# Patient Record
Sex: Female | Born: 1970
Health system: Southern US, Community
[De-identification: ages and names within clinical notes are randomized; demographics above are authoritative.]

## PROBLEM LIST (undated history)

## (undated) DIAGNOSIS — M778 Other enthesopathies, not elsewhere classified: Secondary | ICD-10-CM

## (undated) DIAGNOSIS — I10 Essential (primary) hypertension: Secondary | ICD-10-CM

## (undated) DIAGNOSIS — M5430 Sciatica, unspecified side: Secondary | ICD-10-CM

## (undated) HISTORY — PX: NO PAST SURGERIES: SHX2092

## (undated) HISTORY — DX: Other enthesopathies, not elsewhere classified: M77.8

---

## 1998-08-01 HISTORY — PX: TUBAL LIGATION: SHX77

## 2002-01-02 ENCOUNTER — Other Ambulatory Visit: Admission: RE | Admit: 2002-01-02 | Discharge: 2002-01-02 | Payer: Self-pay | Admitting: *Deleted

## 2002-12-09 ENCOUNTER — Other Ambulatory Visit: Admission: RE | Admit: 2002-12-09 | Discharge: 2002-12-09 | Payer: Self-pay | Admitting: *Deleted

## 2003-09-24 ENCOUNTER — Inpatient Hospital Stay (HOSPITAL_COMMUNITY): Admission: AD | Admit: 2003-09-24 | Discharge: 2003-10-03 | Payer: Self-pay | Admitting: *Deleted

## 2003-10-17 ENCOUNTER — Encounter (INDEPENDENT_AMBULATORY_CARE_PROVIDER_SITE_OTHER): Payer: Self-pay | Admitting: Specialist

## 2003-10-17 ENCOUNTER — Inpatient Hospital Stay (HOSPITAL_COMMUNITY): Admission: RE | Admit: 2003-10-17 | Discharge: 2003-10-20 | Payer: Self-pay | Admitting: *Deleted

## 2003-11-26 ENCOUNTER — Other Ambulatory Visit: Admission: RE | Admit: 2003-11-26 | Discharge: 2003-11-26 | Payer: Self-pay | Admitting: *Deleted

## 2006-01-26 IMAGING — US US FETAL BPP W/O NONSTRESS
1 series · 18 of 25 positions shown · non-contrast
Comparison: none

CLINICAL DATA: Oligohydramnios.  Previous history of fetal demise.

[Series 1: us fetal bpp w/o nonstress · non-contrast · 18 of 25 slices shown]
[im 1/25]
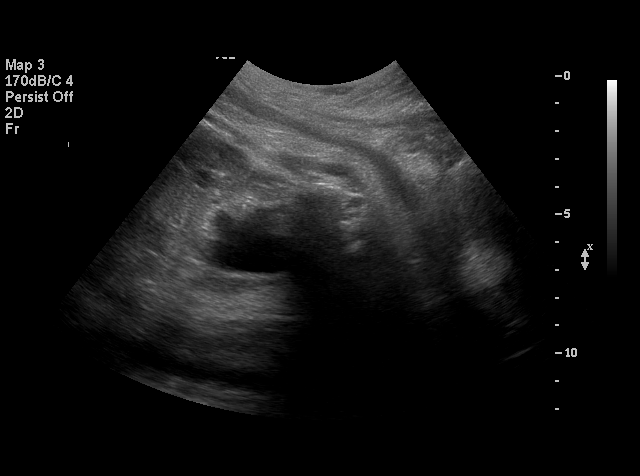
[im 3/25]
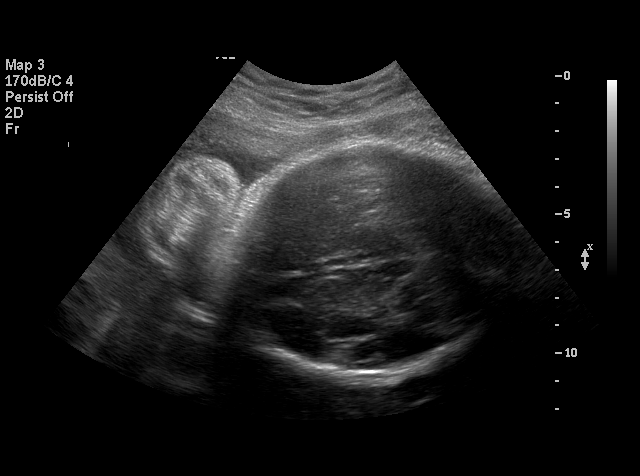
[im 4/25]
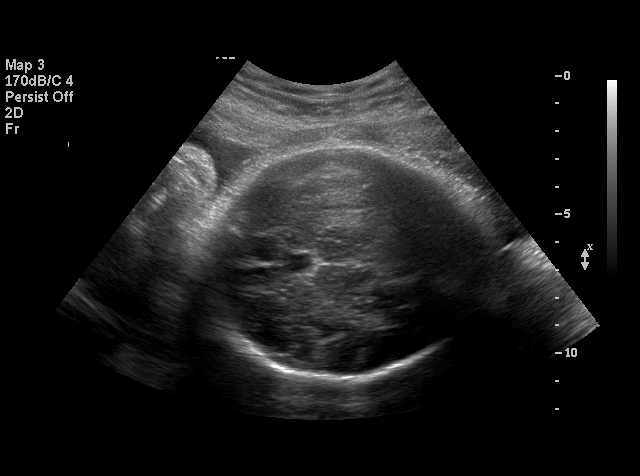
[im 5/25]
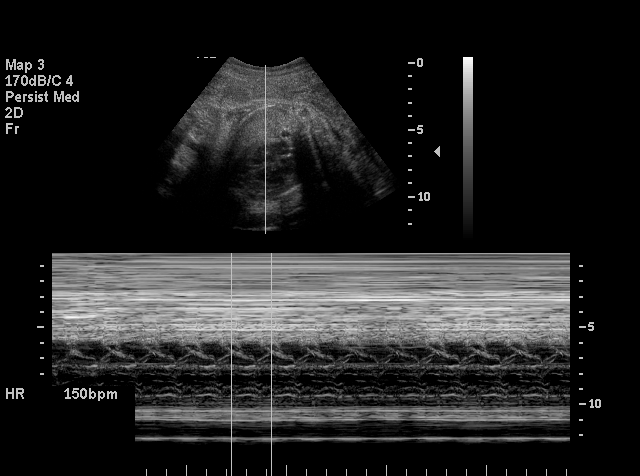
[im 7/25]
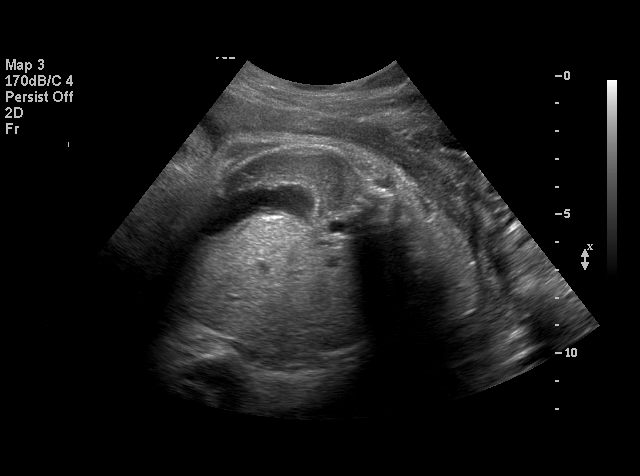
[im 8/25]
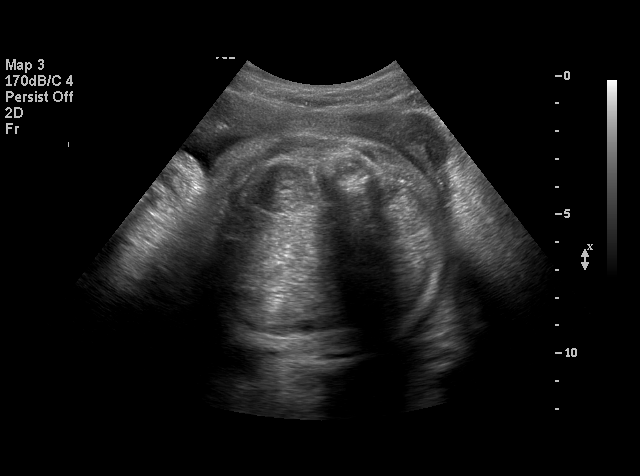
[im 10/25]
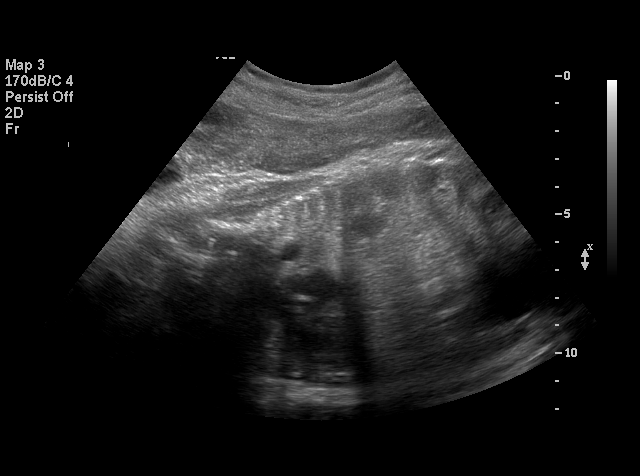
[im 11/25]
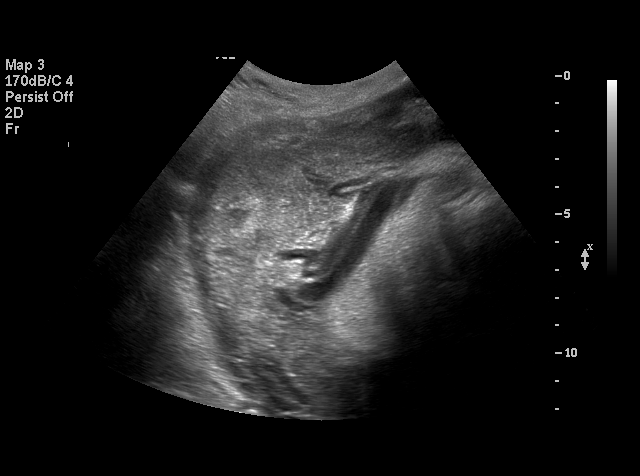
[im 12/25]
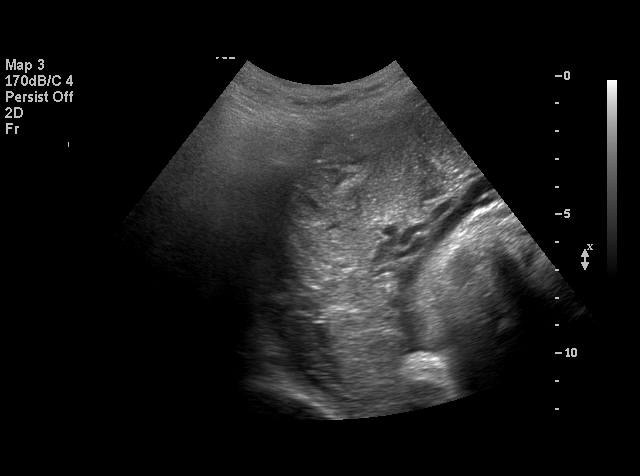
[im 14/25]
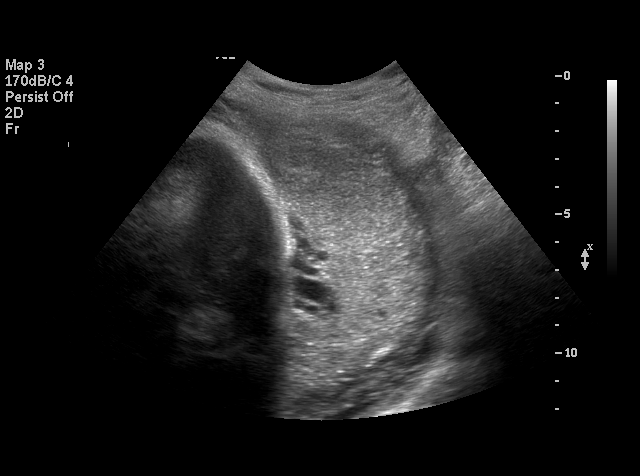
[im 15/25]
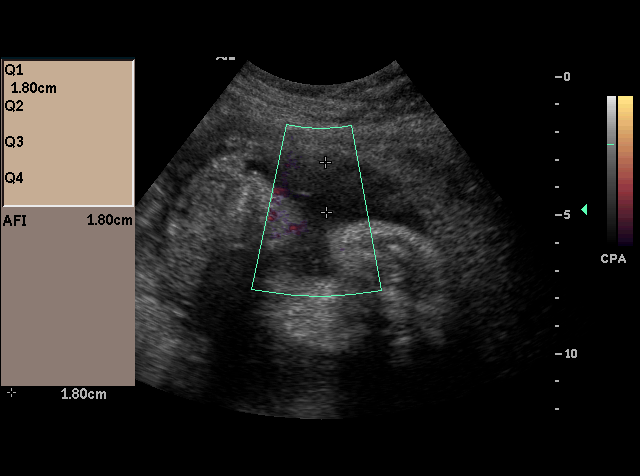
[im 16/25]
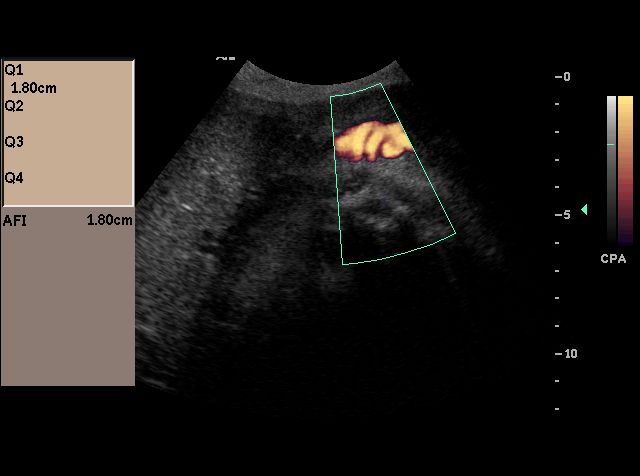
[im 18/25]
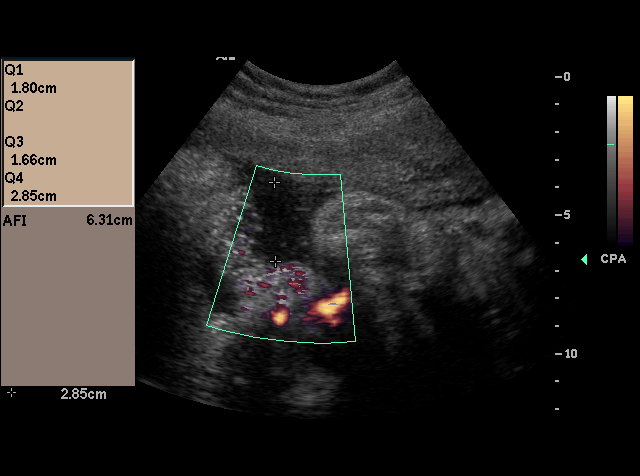
[im 19/25]
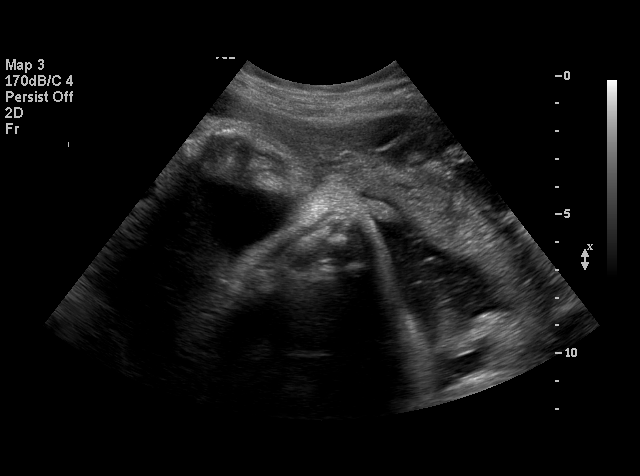
[im 21/25]
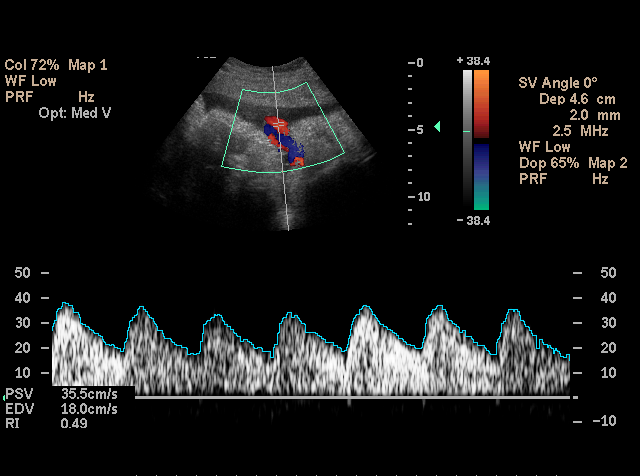
[im 22/25]
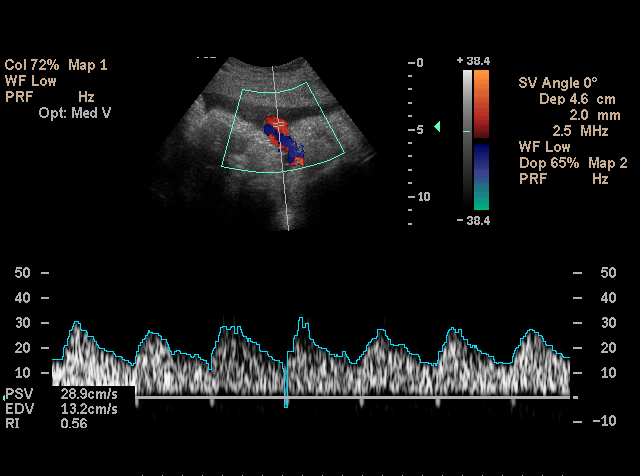
[im 23/25]
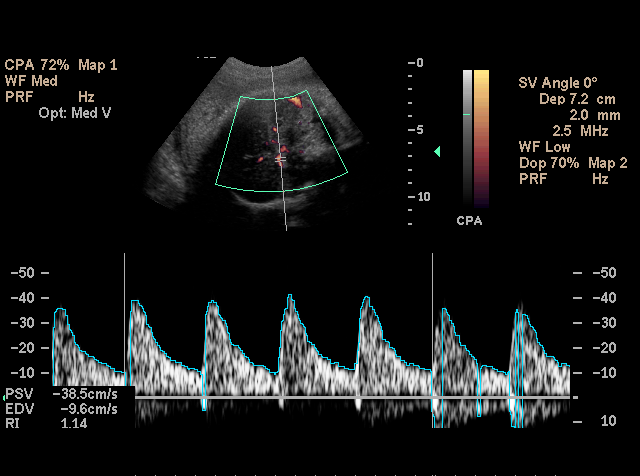
[im 25/25]
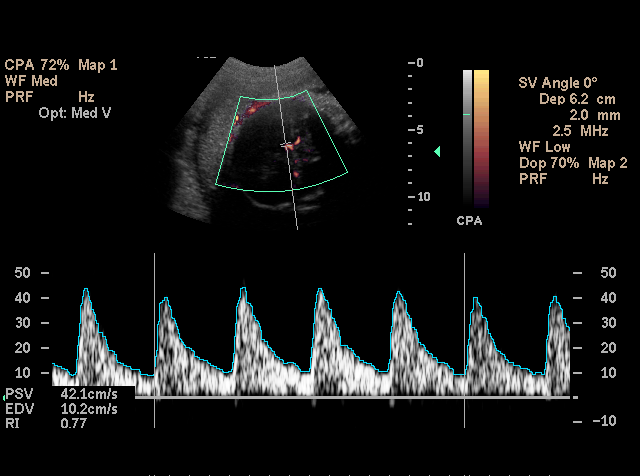

[18 of 25 positions shown; findings below may reference images not displayed]

BIOPHYSICAL PROFILE

Number of Fetuses: 1
Heart rate: 150 BPM
Movement: Yes
Breathing: No
Presentation: Breech
Placental Location:  Posterior
Grade: I 
Previa:  No
Amniotic Fluid (Subjective): Decreased
Amniotic Fluid (Objective): 6.3 cm AFI (6th-56th %ile = 8.3 to 24.5 cm for 33 weeks)

Fetal measurements and complete anatomic evaluation were not requested.  The following fetal anatomy was visualized on this exam:  lateral ventricles, thalamus, stomach, kidneys, bladder, and diaphragm.

BPP SCORING
Movements: 2               Time:  30 min.
Breathing: 0
Tone: 2
Amniotic Fluid: 2
Total Score: 6

MATERNAL FINDINGS:
Cervix:   Not evaluated.
IMPRESSION: Single living intrauterine fetus in breech presentation.  Amniotic fluid volume is subjectively decreased, with AFI of 6.3 cm.  Biophysical profile score is [DATE].
DOPPLER ULTRASOUND OF FETUS

Umbilical Artery S/D Ratio:  2.0 (NL< 3.70)

Middle Cerebral Artery PI: 1.69  (NL> 1.40)

IMPRESSION 
Both umbilical artery and fetal middle cerebral artery Doppler measurements are within normal range.

## 2006-01-27 IMAGING — US US OB COMP +14 WK
1 series · 18 of 26 positions shown · non-contrast
Comparison: none

CLINICAL DATA: 32-year-old.  G2 P2 with LMP 02/08/03.  Oligohydramnios.  Preterm labor.

[Series 1: us ob comp +14 wk · 18 of 26 slices shown]
[im 1/26]
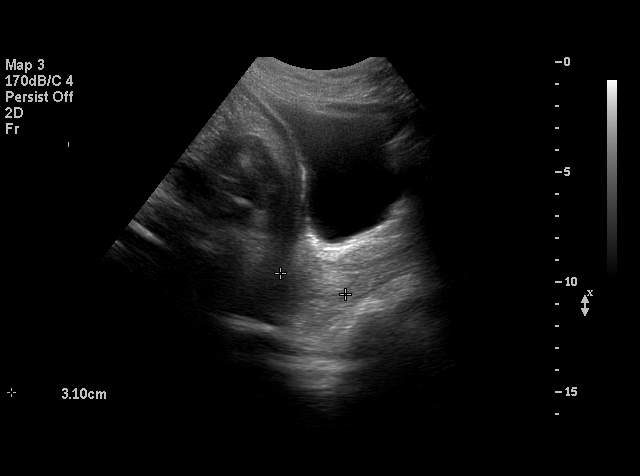
[im 3/26]
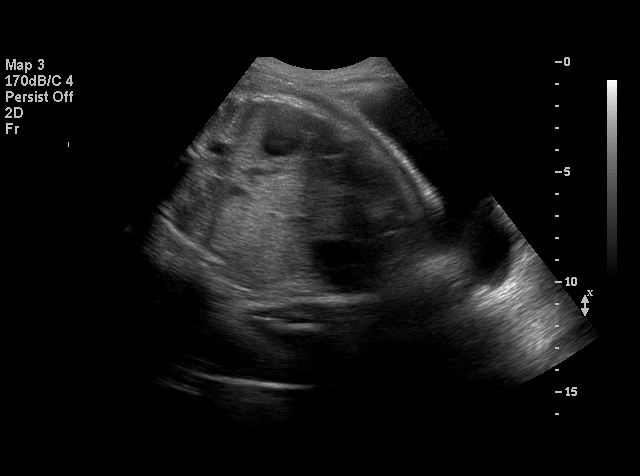
[im 4/26]
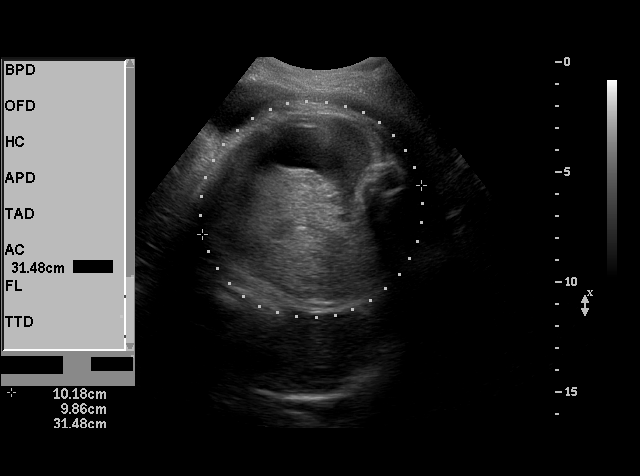
[im 6/26]
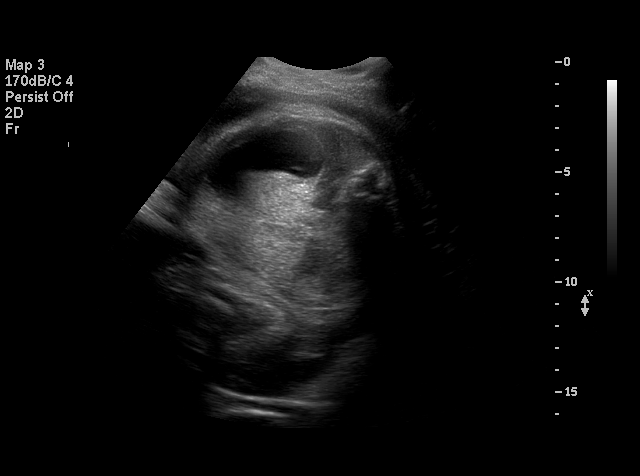
[im 7/26]
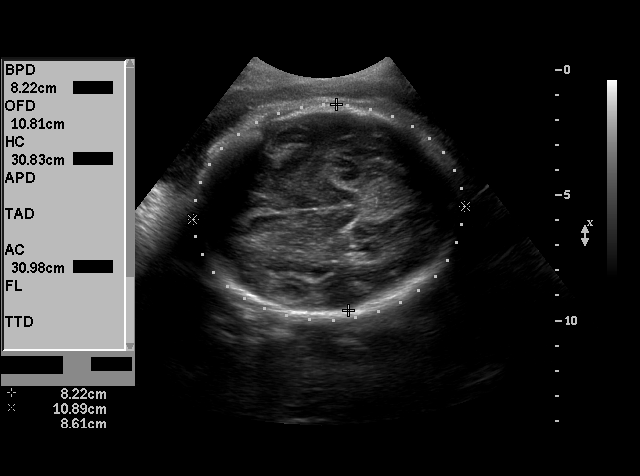
[im 8/26]
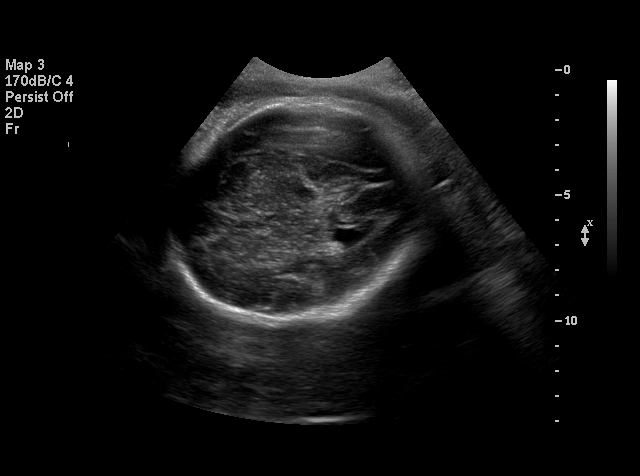
[im 10/26]
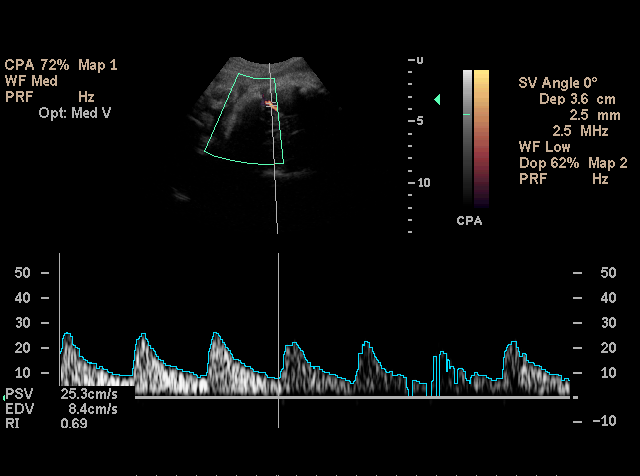
[im 11/26]
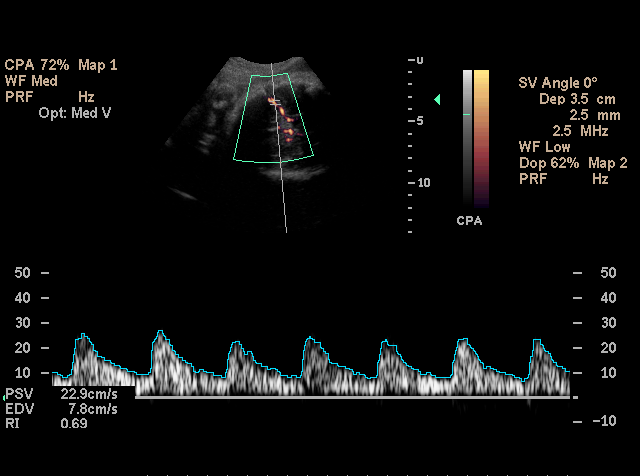
[im 13/26]
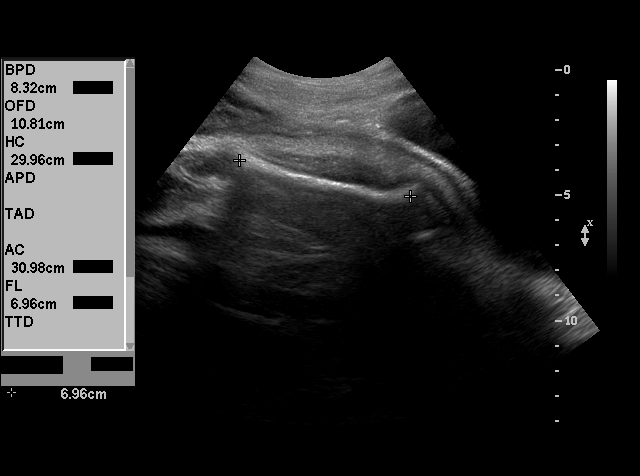
[im 14/26]
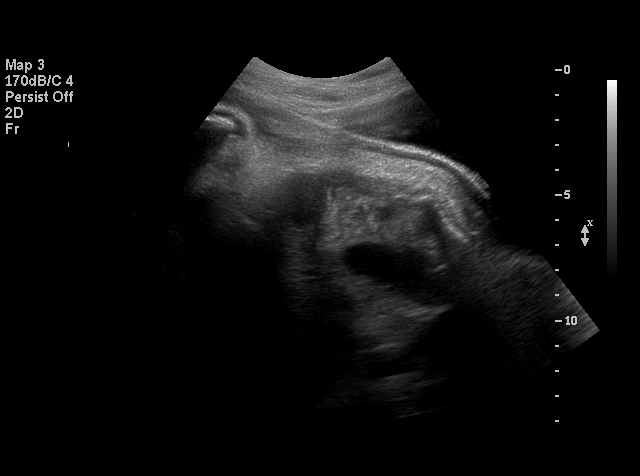
[im 16/26]
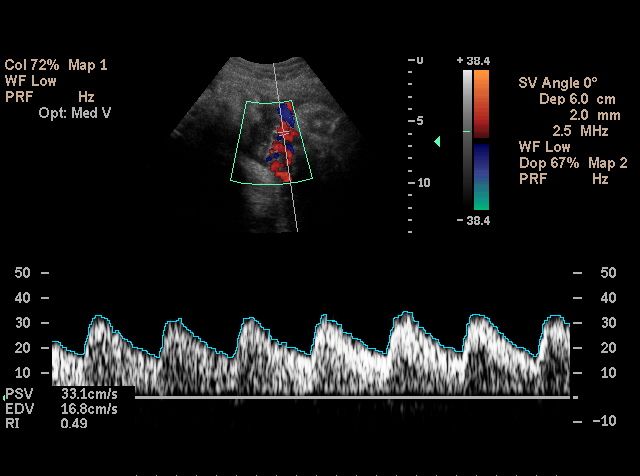
[im 17/26]
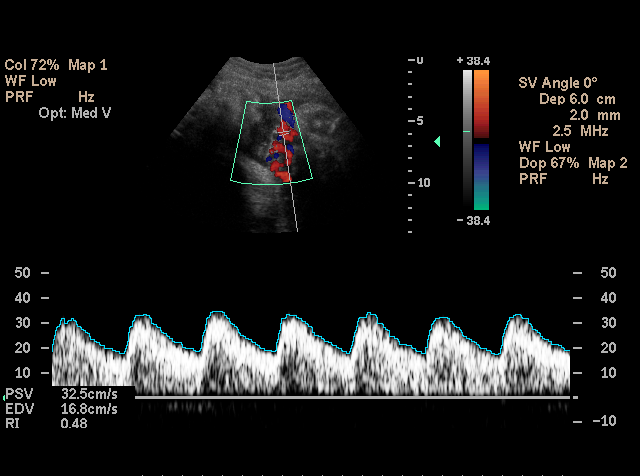
[im 19/26]
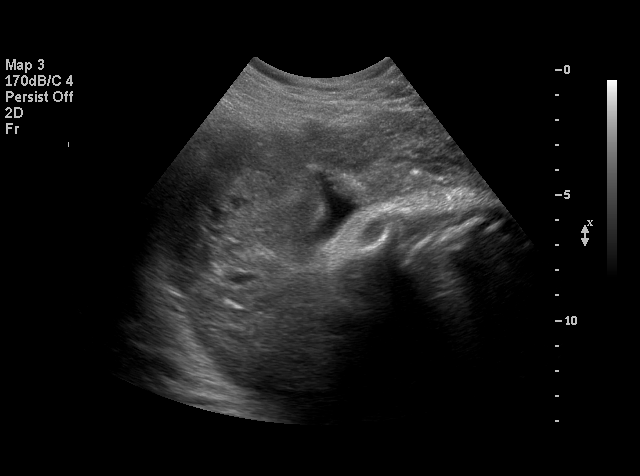
[im 20/26]
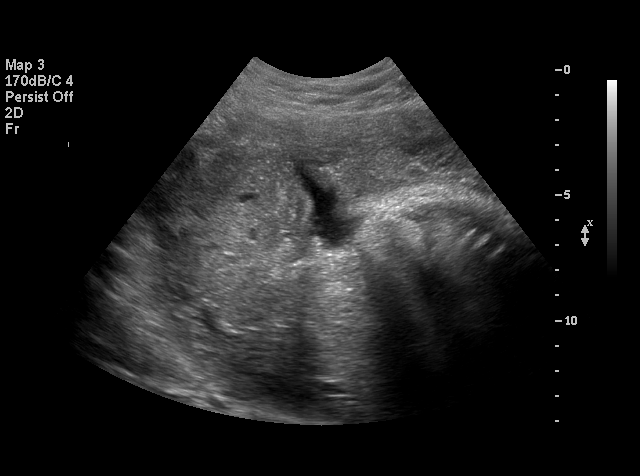
[im 21/26]
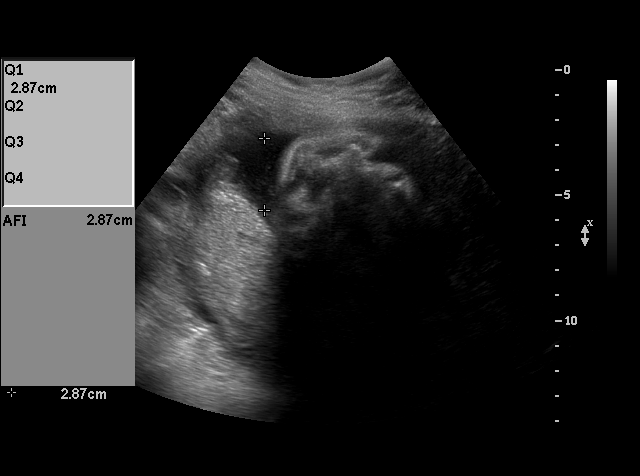
[im 23/26]
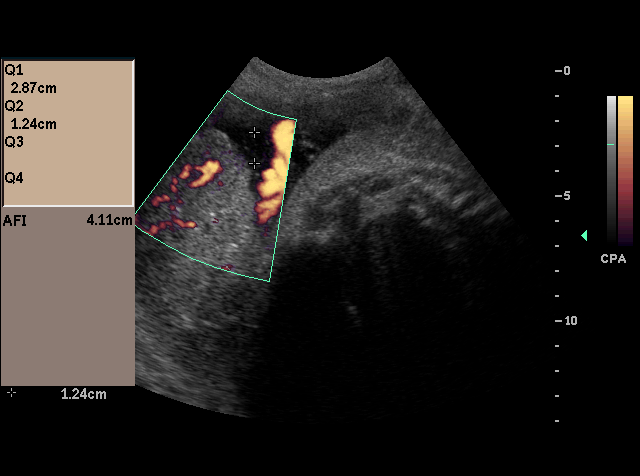
[im 24/26]
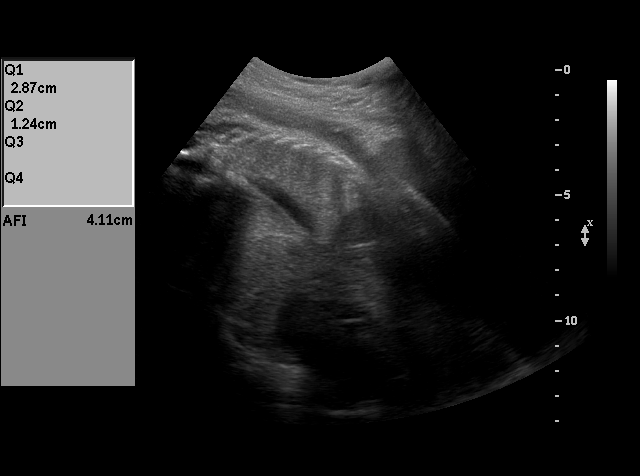
[im 26/26]
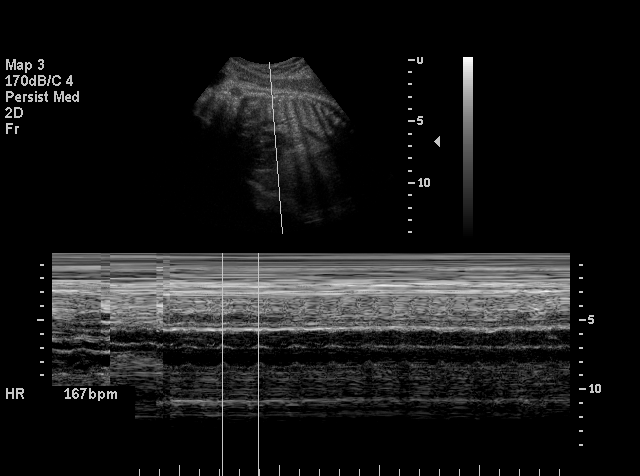

[18 of 26 positions shown; findings below may reference images not displayed]

OBSTETRICAL ULTRASOUND
 Number of Fetuses:  1
 Heart Rate:  167
 Movement:  Yes
 Breathing:  Yes  
 Presentation:  Breech
 Placental Location:  Posterior
 Grade:  II
 Previa:  No
 Amniotic Fluid (Subjective):  Decreased
 Amniotic Fluid (Objective):   6.7 cm AFI (5th -95th%ile =   8.3 ? 24.5 cm for 33 wks)

 FETAL BIOMETRY
 BPD:   8.3 cm   33 w 3 d
 HC:   30.3 cm   33 w 5 d
 AC:   31.3 cm   35 w 2 d
 FL:   6.9 cm   35 w 4 d

 MEAN GA:  34 w 4 d
 GA BY LMP:  33 w 1 d (Assigned)
 EFW:  3222 g   90th ? 95th%ile (2828 ? 4646 g) For 33 wks

 FETAL ANATOMY
 Lateral Ventricles:    Visualized 
 Thalami/CSP:      Previously seen 
 Posterior Fossa:  Previously seen 
 Nuchal Region:    N/A
 Spine:      Previously seen 
 4 Chamber Heart on Left:      Previously seen 
 Stomach on Left:      Visualized 
 3 Vessel Cord:    Previously seen 
 Cord Insertion site:    Not visualized 
 Kidneys:  Previously seen 
 Bladder:  Visualized 
 Extremities:      Not visualized 

 MATERNAL FINDINGS
 Cervix:   3.1 cm Transabdominally

 BIOPHYSICAL PROFILE

 Movement:  2    Time:  10 minutes
 Breathing:  2
 Tone:  2
 Amniotic Fluid:  2

 Total Score:  8

 DOPPLER ULTRASOUND OF FETUS

 Umbilical Artery S/D Ratio: 1.90    (NL< 3.70)

 Middle Cerebral Artery PI: 1.21    (NL> 1.40)
IMPRESSION: Single living intrauterine fetus in breech presentation.
 Oligohydramnios.  
 Appropriate interval growth.
 Biophysical profile score 8 of 8.
 MCA doppler pulsatility index is decreased as given.  Umbilical artery S/D ratio is within normal limits.

## 2006-01-28 IMAGING — US US OB MCA DOPPLER
1 series · 13 of 21 positions shown · non-contrast
Comparison: none

CLINICAL DATA: Pregnancy induced hypertension with a history of prior IUFD.  32 weeks with oligohydramnios.

[Series 1: unknown · 0.28mm/px · 13 of 21 slices shown]
[im 1/21]
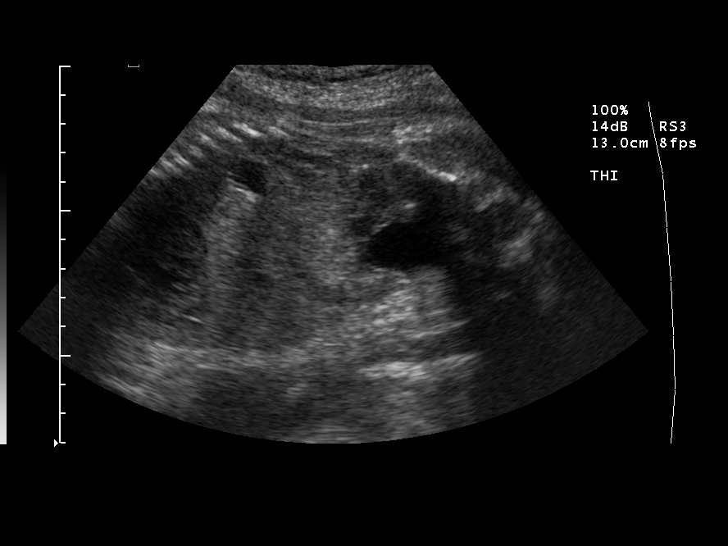
[im 3/21]
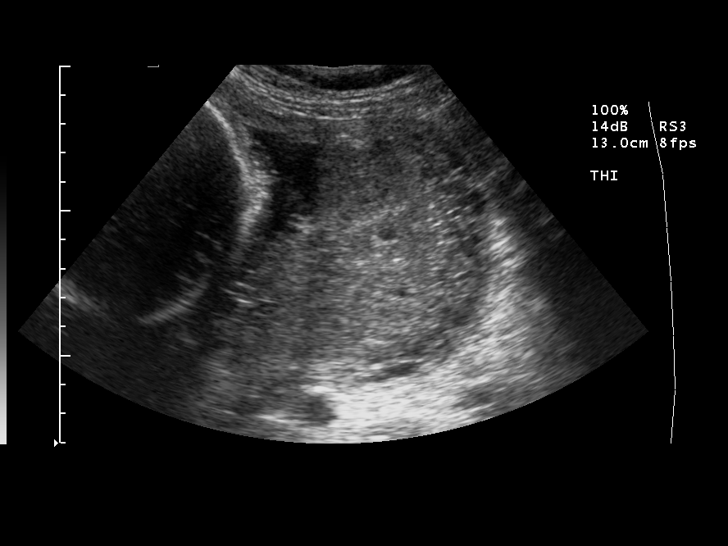
[im 5/21]
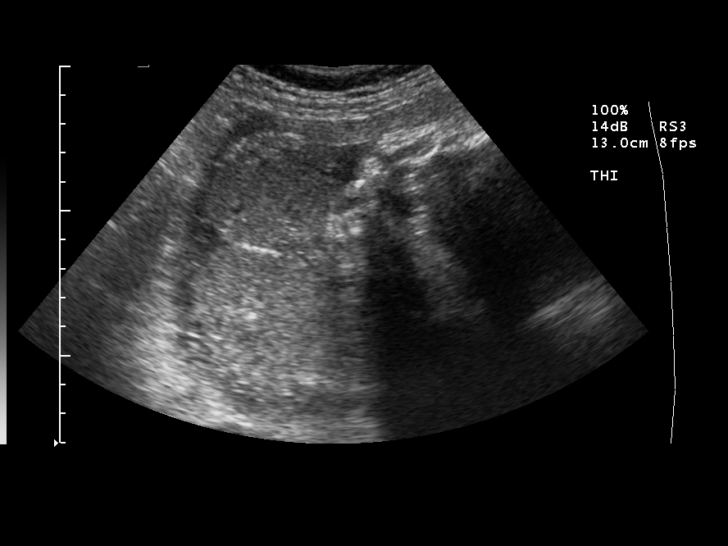
[im 6/21]
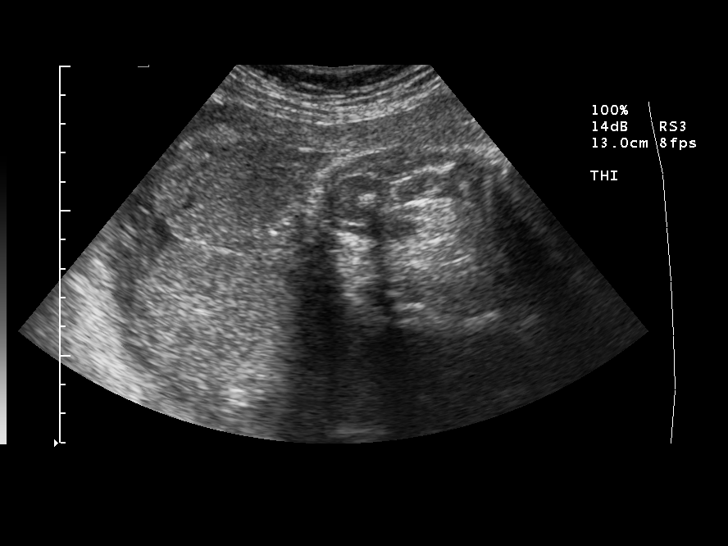
[im 8/21]
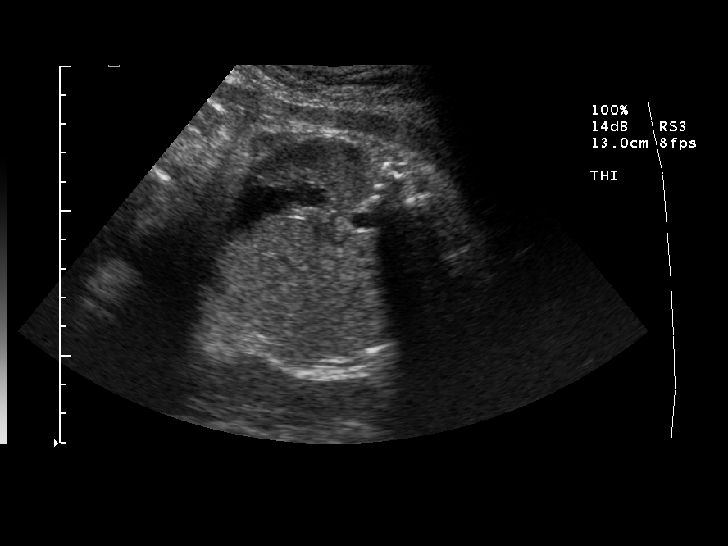
[im 9/21]
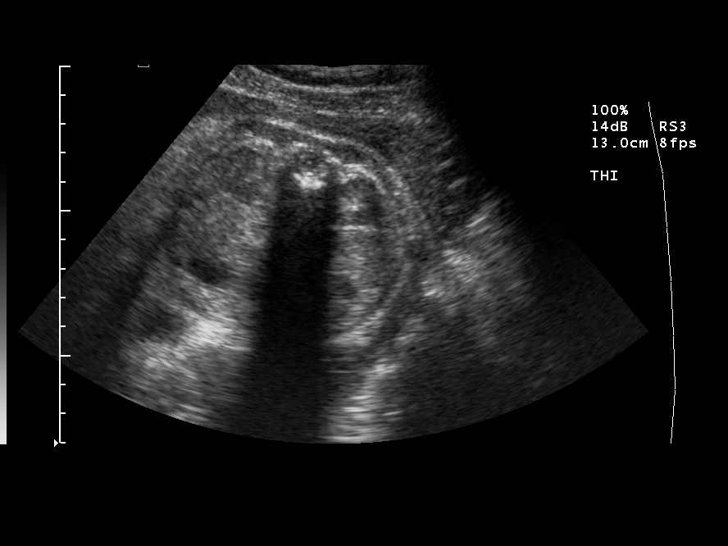
[im 11/21]
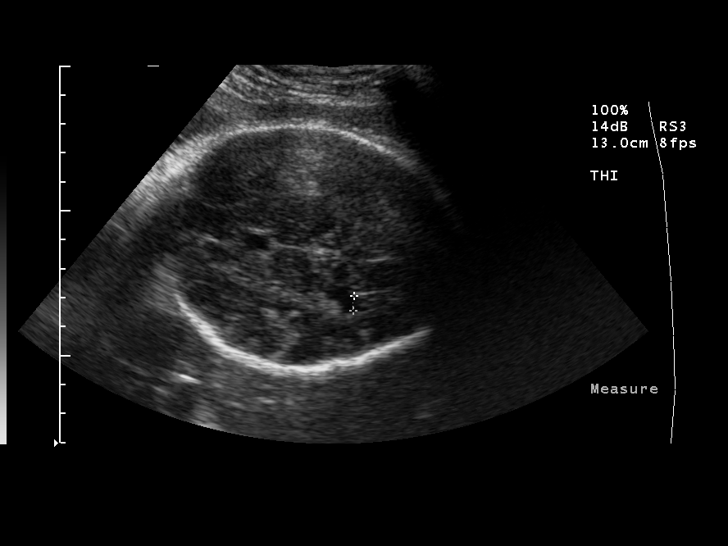
[im 13/21]
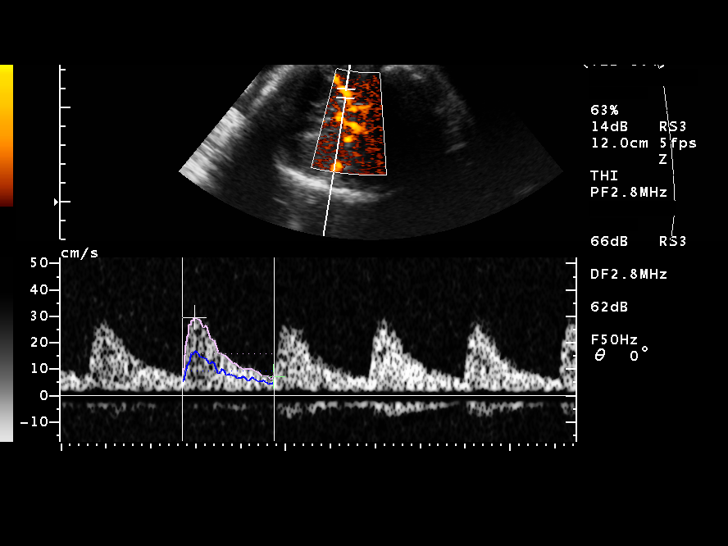
[im 14/21]
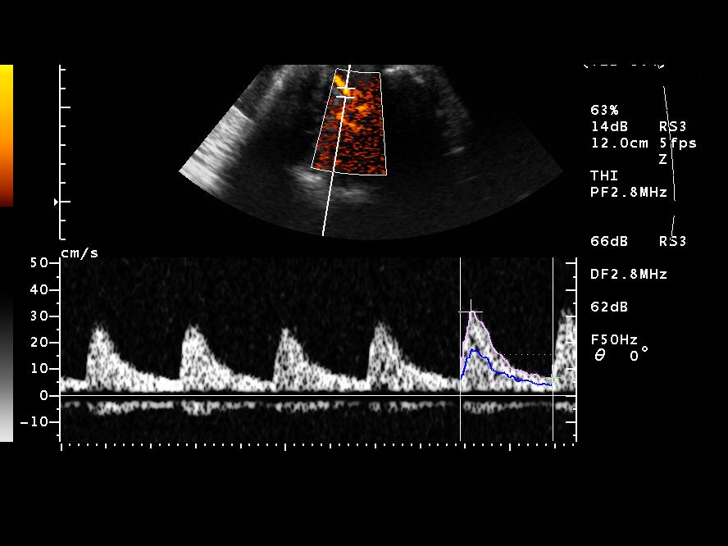
[im 16/21]
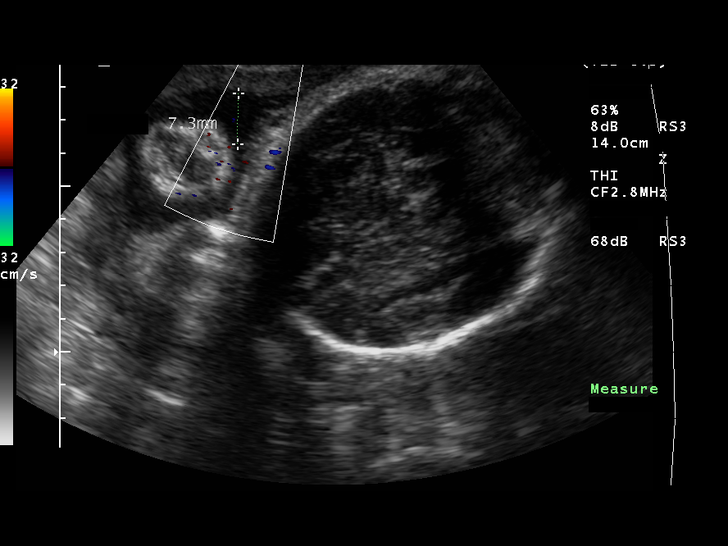
[im 17/21]
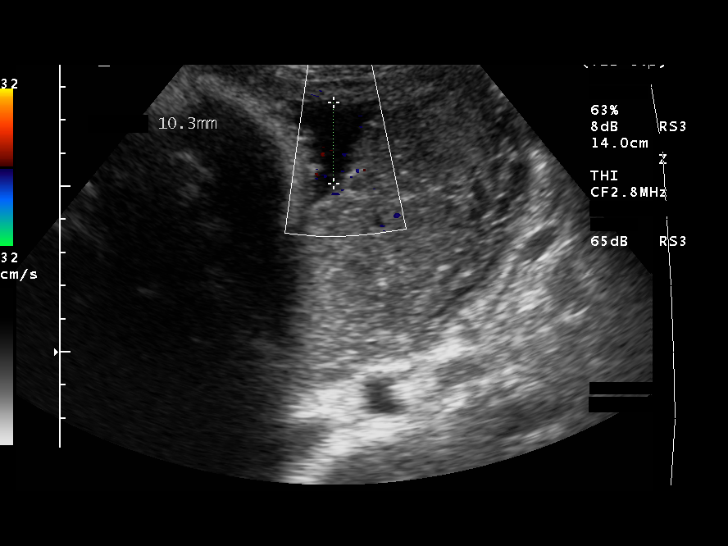
[im 19/21]
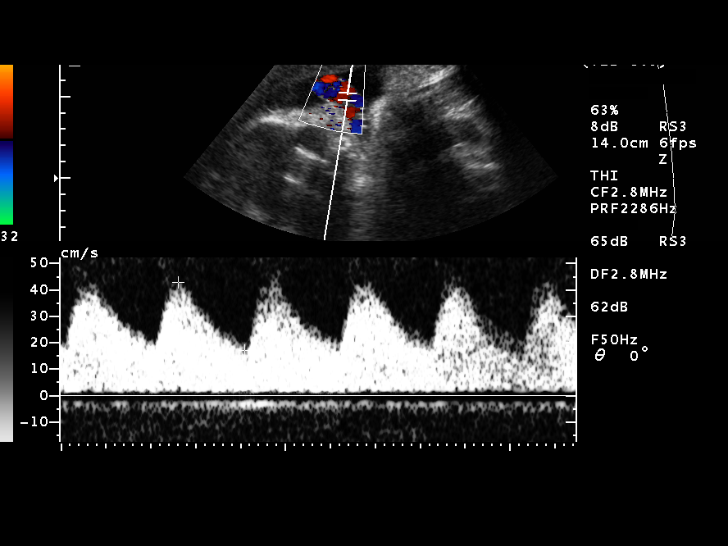
[im 21/21]
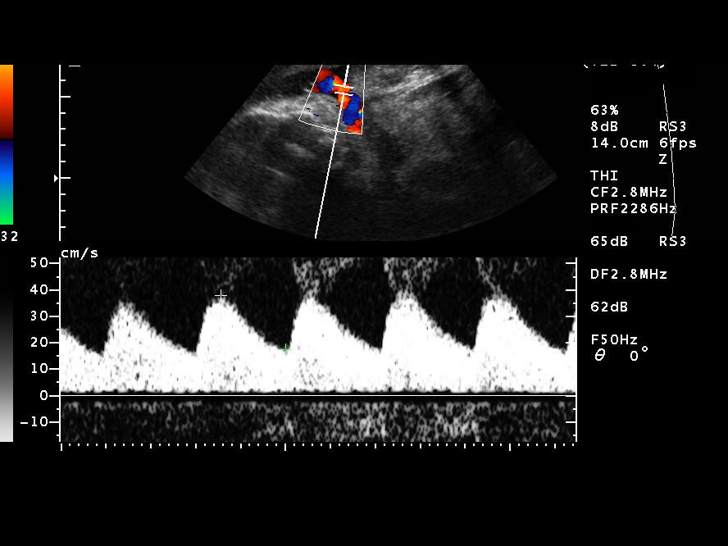

[13 of 21 positions shown; findings below may reference images not displayed]

BIOPHYSICAL PROFILE

Number of Fetuses:  1
Heart rate:  162
Movement:  Yes
Breathing:  No
Presentation:  Breech
Placental Location:  Posterior
Grade:  II
Previa:  No
Amniotic Fluid (Subjective):  Decreased
Amniotic Fluid (Objective):  7.4 cm AFI (5th -95th%ile =   8.3 ? 24.5 cm for 33 wks)

Fetal measurements and complete anatomic evaluation were not requested.  The following fetal anatomy was visualized on this exam:  Lateral ventricles, stomach, kidneys, and bladder.

BPP SCORING
Movements:  2  
Breathing:  2
Tone:  2
Amniotic Fluid:  2
Total Score:  8

MATERNAL FINDINGS:
Cervix:  Not evaluated.

DOPPLER ULTRASOUND OF FETUS

Umbilical Artery S/D Ratio: 2.40   (NL< 3.70)

Middle Cerebral Artery PI: 1.60    (NL> 1.40)
IMPRESSION: Biophysical profile score 8 of 8.  
Subjectively and quantitatively decreased amniotic fluid volume.
Normal umbilical artery and middle cerebral artery doppler assessment. 
Reevaluation of the kidneys, bladder, stomach, and lateral ventricles demonstrate no late developing anatomic abnormalities.  A four chamber heart view could not be reassessed due to position.

## 2006-01-30 IMAGING — US US UA DOPPLER RE-EVAL
1 series · 13 of 28 positions shown · non-contrast
Comparison: none

CLINICAL DATA: 33 week 4 day assigned gestational age.  Oligohydramnios.  Follow-up amniotic fluid volume, biophysical profile and fetal dopplers.

[Series 1: unknown · 0.32mm/px · 13 of 48 slices shown]
[im 2/48]
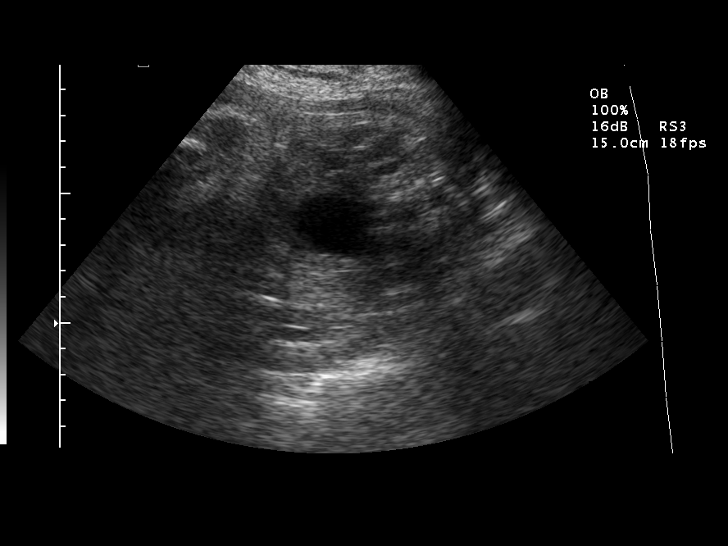
[im 6/48]
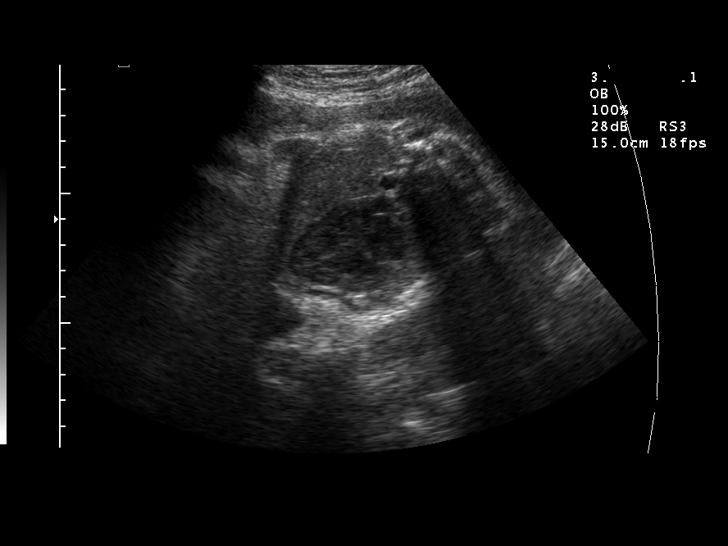
[im 9/48]
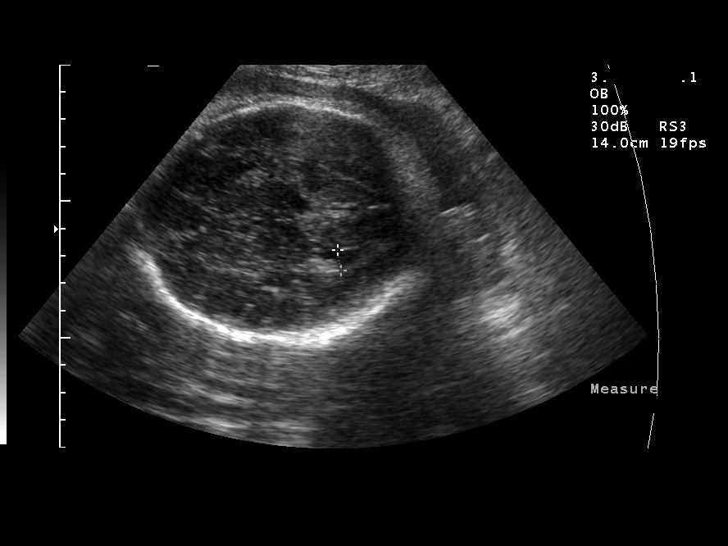
[im 13/48]
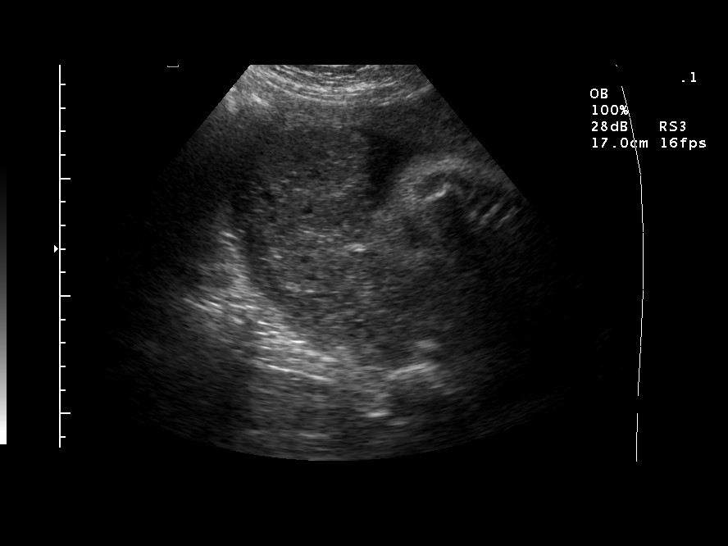
[im 16/48]
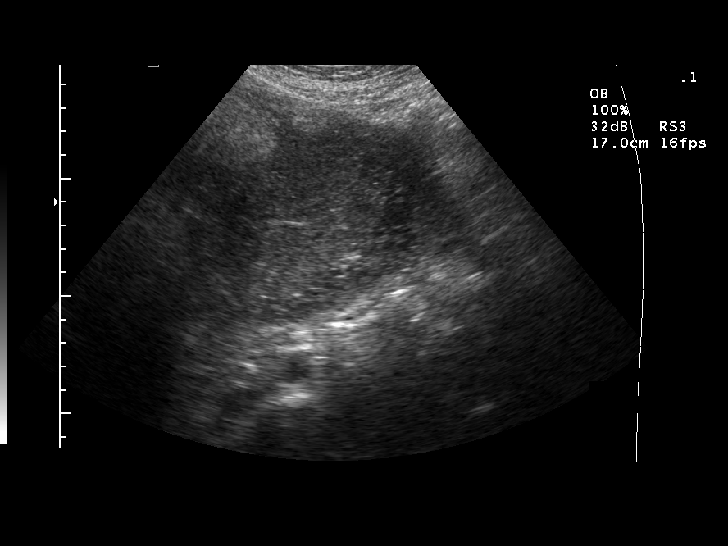
[im 20/48]
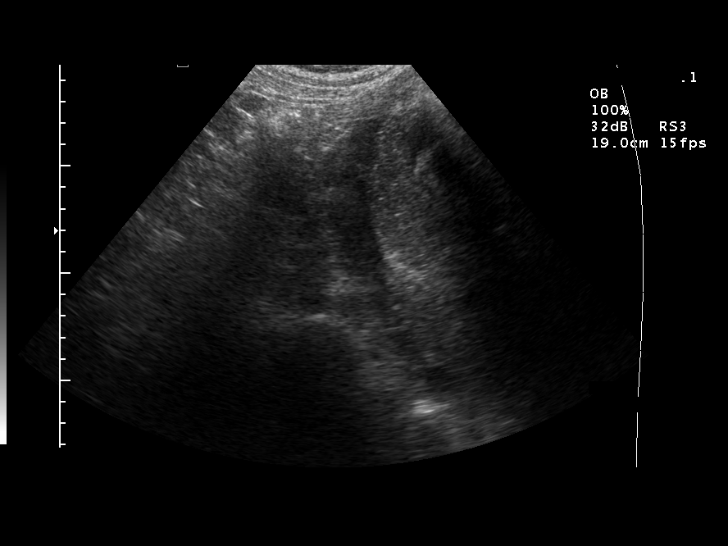
[im 25/48]
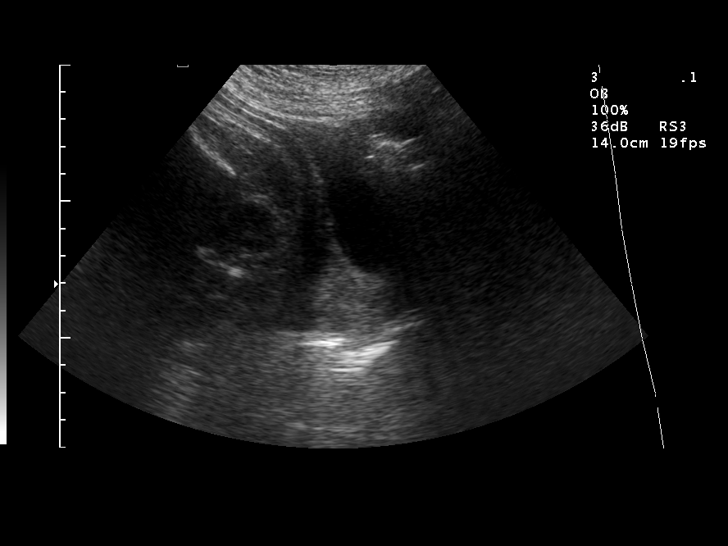
[im 28/48]
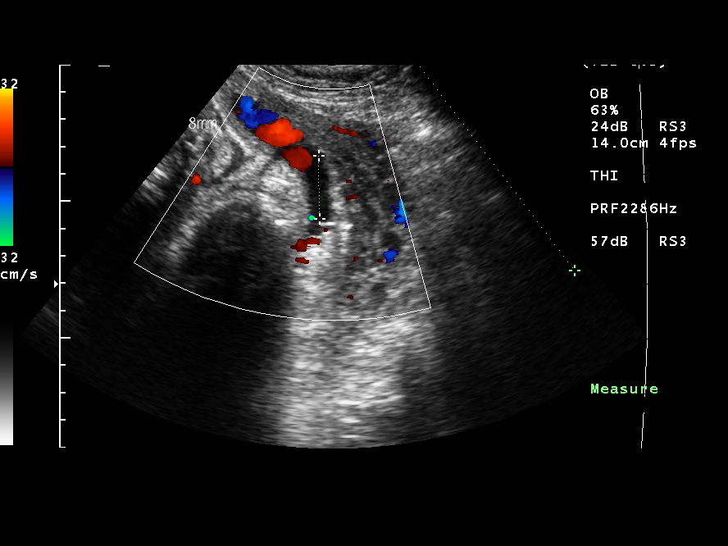
[im 32/48]
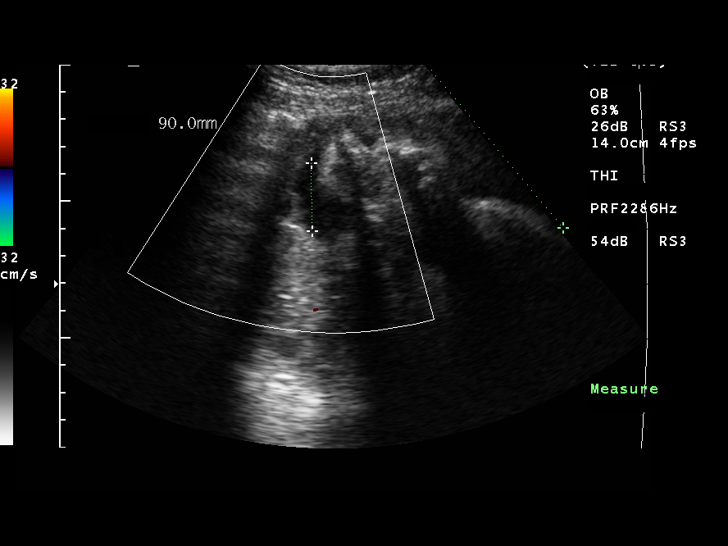
[im 35/48]
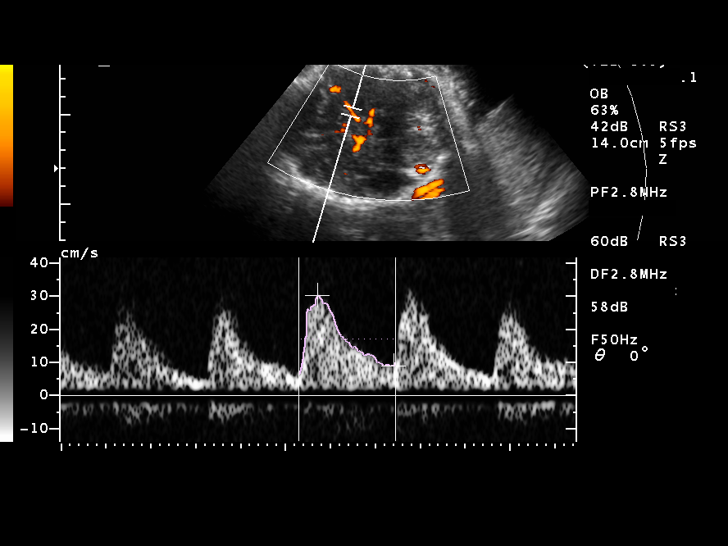
[im 39/48]
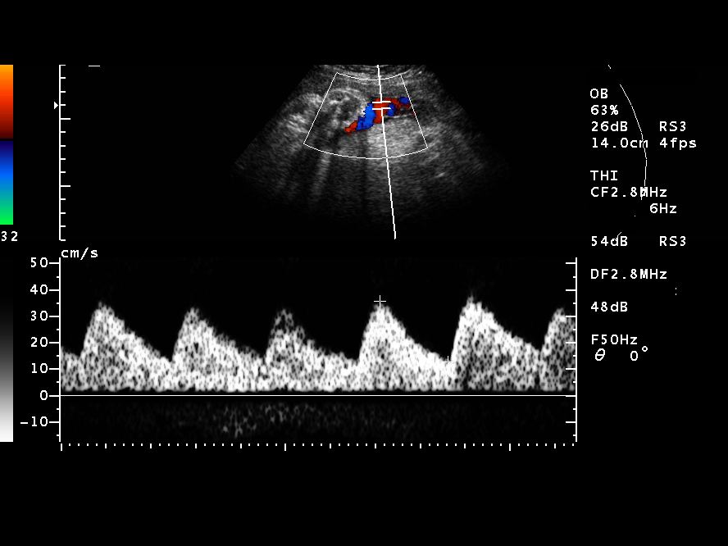
[im 42/48]
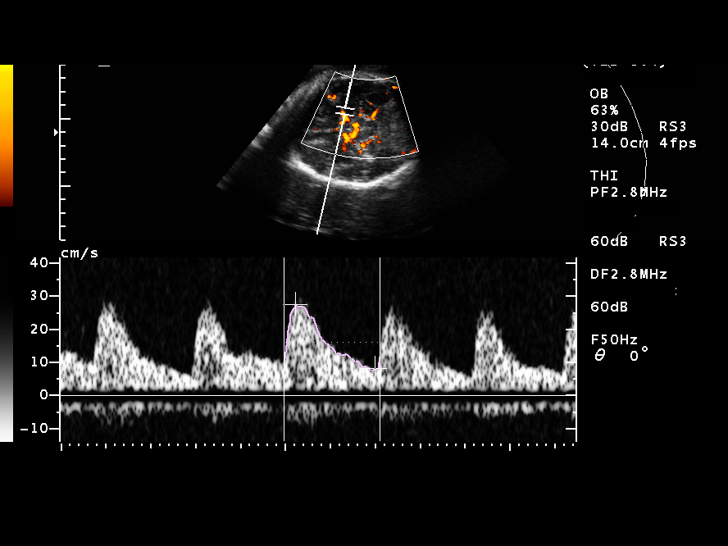
[im 46/48]
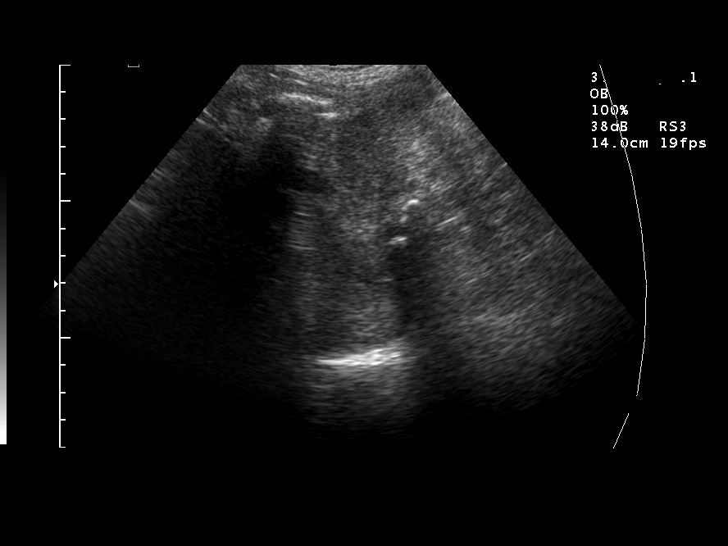

[13 of 28 positions shown; findings below may reference images not displayed]

BIOPHYSICAL PROFILE

Number of Fetuses:  1
Heart rate:  153
Movement:  Yes
Breathing:  Yes
Presentation:  Breech
Placental Location:  Posterior
Grade: II
Previa:  No
Amniotic Fluid (Subjective):  Decreased
Amniotic Fluid (Objective):  8.1 cm AFI (5th -95th%ile =   8.1 ? 24.8 cm for 34 wks)

Fetal measurements and complete anatomic evaluation were not requested.  The following fetal anatomy was visualized on this exam:  Lateral ventricles, thalami, stomach, 3-vessel cord, kidneys, bladder and diaphragm

BPP SCORING
Movements:  2  Time:  15 minutes
Breathing:  2
Tone:  2
Amniotic Fluid:  2
Total Score:  8

MATERNAL FINDINGS:
Cervix:  3.1 cm Translabially
IMPRESSION: Single living intrauterine fetus in breech presentation.  
Biophysical profile score [DATE].
Amniotic fluid volume remains subjectively decreased, with AFI of 8.1 cm which is at 5th percentile.  
DOPPLER ULTRASOUND OF FETUS

Umbilical Artery S/D Ratio: 2.52    (NL< 3.57)

Middle Cerebral Artery PI:   1.40    (NL> 1.35)
IMPRESSION: Both umbilical artery and fetal middle cerebral artery doppler measurements are within normal limits.

## 2006-02-06 ENCOUNTER — Encounter: Admission: RE | Admit: 2006-02-06 | Discharge: 2006-02-06 | Payer: Self-pay | Admitting: *Deleted

## 2008-12-08 ENCOUNTER — Emergency Department (HOSPITAL_BASED_OUTPATIENT_CLINIC_OR_DEPARTMENT_OTHER): Admission: EM | Admit: 2008-12-08 | Discharge: 2008-12-08 | Payer: Self-pay | Admitting: Emergency Medicine

## 2010-05-20 ENCOUNTER — Encounter: Admission: RE | Admit: 2010-05-20 | Discharge: 2010-05-20 | Payer: Self-pay | Admitting: Obstetrics and Gynecology

## 2010-12-17 NOTE — H&P (Signed)
Claire Medina, Claire Medina                           ACCOUNT NO.:  [id number]   MEDICAL RECORD NO.:  [id number]                   PATIENT TYPE:  INP   LOCATION:  9155                                 FACILITY:  [facility]   PHYSICIAN:  Lenoard Aden, M.D.             DATE OF BIRTH:  [birth year]   DATE OF ADMISSION:  09/24/2003  DATE OF DISCHARGE:                                HISTORY & PHYSICAL   CHIEF COMPLAINT:  Oligohydramnios.   HISTORY OF PRESENT ILLNESS:  The patient is a 40 year old African-American  female, G3, P0, at 32-3/7 weeks, with new-onset oligohydramnios and an AFI  of 6.8, for hospitalization.   Past medical history is remarkable for no known drug allergies.   Medications:  Prenatal vitamins.   Obstetric history is complicated by a 30-week intrauterine pregnancy with  twin demise and C-section.  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  She subsequently recovered and had an IUD placed,  which was removed, then became pregnant after consultation.   SOCIAL HISTORY:  Noncontributory.   PAST MEDICAL HISTORY:  Remarkable for a C-section as noted and no other  medical or surgical hospitalizations.  She did have multiple blood  transfusions and dialysis with her postpartum course.   She has a family history of hypertension, heart disease, kidney disease, and  lung cancer.   Prenatal course to date reveals a negative thrombophilia workup, a negative  lupus anticoagulant and anticardiolipin antibodies.  She has had an  appropriately-grown intrauterine singleton gestation, which on last  ultrasound performed on September 17, 2003, showed an estimated fetal weight  of 3 pounds 15 ounces, in the 39th percentile, and an AFI of 16.7.  AFI  today, as noted, was 6.8.   Prenatal lab data reveals a blood type  of O positive.  Rh antibody negative.  Hemoglobin electrophoresis within normal limits.  Rubella immune.  Hepatitis, HIV negative.  VDRL nonreactive.  Triple screen performed reveals  an elevation in risk for Down syndrome of one in 53.  The patient had a  normal targeted ultrasound but declined amniocentesis.  She had a normal one-  hour GTT of 111.   PHYSICAL EXAMINATION:  GENERAL:  The patient is a well-developed, well-  nourished African-American female in no acute distress.  HEENT:  Normal.  CHEST:  Lungs clear.  CARDIAC:  Regular rate and rhythm.  ABDOMEN:  Soft, gravid, nontender.  PELVIC:  Cervical exam is deferred.  EXTREMITIES:  No cords.  NEUROLOGIC:  Nonfocal.  BPP 6/8 today.   IMPRESSION:  1. A 32-3/7 intrauterine pregnancy.  2. Poor obstetric history with a history of twin intrauterine demise in the     third trimester, complicated by hemolytic uremic syndrome and thrombotic     thrombocytopenic purpura.  3. Previous cesarean section.   PLAN:  1. Admit.  2. Proceed with IV fluid hydration.  3. Daily BPP with Dopplers.  4. Betamethasone 12.5 mg IM today, repeat in 24 hours.  5. Expectant management discussed at this time.  The patient is a candidate     for repeat C-section and tubal ligation upon delivery.                                               Lenoard Aden, M.D.    RJT/MEDQ  D:  09/24/2003  T:  09/24/2003  Job:  [id number]

## 2010-12-17 NOTE — Discharge Summary (Signed)
Claire Medina, Claire Medina                           ACCOUNT NO.:  [id number]   MEDICAL RECORD NO.:  [id number]                   PATIENT TYPE:  INP   LOCATION:  9155                                 FACILITY:  [facility]   PHYSICIAN:  Gerri Spore B. Earlene Plater, M.D.               DATE OF BIRTH:  03-Oct-1970   DATE OF ADMISSION:  09/24/2003  DATE OF DISCHARGE:  10/03/2003                                 DISCHARGE SUMMARY   ADMISSION DIAGNOSES:  1. A 32-week intrauterine pregnancy.  2. Oligohydramnios.   DISCHARGE DIAGNOSES:  1. A 32-week intrauterine pregnancy.  2. Oligohydramnios, improved.   HISTORY OF PRESENT ILLNESS:  A 40 year old African-American female, gravida  3, para 0, admitted at 3 and 3/7ths weeks with new onset of oligohydramnios  with an AFI of 6.8, for hospitalization and monitoring of fluids.   HOSPITAL COURSE:  The patient was admitted, placed on IV hydration,  increased p.o. liquids, and bed rest.  She was initially monitored  continuously, and then 3 times a day.  She had daily Dopplers, fluid checks,  and biophysical profiles.  Initially, AFI decreased to approximately 4, but  over the subsequent days continued to improve up to a peak of 8.1, where it  stabilized.  Her Dopplers remained normal, and biophysical profiles remained  reassuring.  NST remained reactive throughout without any signs of  decelerations.  Given that her fluid had stabilized, and her monitoring had  remained reassuring throughout, I felt it was reasonable to allow the  patient to be discharged to home for bed rest and hydration as an outpatient  orally.  In addition, she will be followed in the office with serial  ultrasound and non-stress testing as an outpatient.   DISCHARGE INSTRUCTIONS:  1. Bed rest.  2. Continue to encourage oral hydration.  3. Notify with any decrease in fetal activity or other symptoms.   DISPOSITION AT DISCHARGE:  Satisfactory and improved.   FOLLOW UP:  [facility], Dr. Earlene Plater,  on October 07, 2003.                                               Gerri Spore B. Earlene Plater, M.D.    WBD/MEDQ  D:  10/03/2003  T:  10/04/2003  Job:  [id number]

## 2010-12-17 NOTE — Discharge Summary (Signed)
NAME:  Claire Medina, Claire Medina                         ACCOUNT NO.:  [id number]   MEDICAL RECORD NO.:  [id number]                   PATIENT TYPE:  INP   LOCATION:  9118                                 FACILITY:  [facility]   PHYSICIAN:  Gerri Spore B. Earlene Plater, M.D.               DATE OF BIRTH:  1971/02/28   DATE OF ADMISSION:  10/17/2003  DATE OF DISCHARGE:  10/20/2003                                 DISCHARGE SUMMARY   ADMISSION DIAGNOSES:  1. Thirty-five-week intrauterine pregnancy.  2. Breech presentation.  3. Oligohydramnios.  4. Biophysical profile 0/8.   DISCHARGE DIAGNOSES:  1. Thirty-five-week intrauterine pregnancy.  2. Breech presentation.  3. Oligohydramnios.  4. Biophysical profile 0/8.   PROCEDURE:  Repeat low transverse cesarean section and bilateral tubal  ligation.   HISTORY OF PRESENT ILLNESS:  A 40 year old African-American female gravida 2  para 0-1-0-0 at 35+ weeks with a history of twin IUFD in her previous  pregnancy at 41 weeks who had been followed as an outpatient with mild  oligohydramnios and breech presentation.  On follow-up ultrasound on the day  of admission but prior to admission in the office biophysical profile was  noted to be 0/8.  Dopplers were normal.  With her history of twin IUFD at 32  weeks with associated hemolytic uremic syndrome - TTP I recommended  proceeding with delivery.  The patient was in agreement.   HOSPITAL COURSE:  On day of admission the patient underwent repeat low  transverse cesarean section and bilateral tubal ligation without incident.  Findings at the time of procedure included a viable female, breech  presentation, 6 pounds 1 ounce, Apgars 8 and 9, cord pH 7.35; normal-  appearing uterus, tubes, and ovaries.   Postoperatively the patient rapidly regained her ability to ambulate, void,  and tolerate a regular diet.  She was discharged to home on postoperative  day #3 in satisfactory condition.   DISCHARGE INSTRUCTIONS:  1. PIH  precautions and recheck blood pressure in 1 week in the office.  2. Standard preprinted prior to dismissal.   DISCHARGE MEDICATIONS:  Tylox  one to two p.o. q.4-6h. p.r.n. pain.   DISPOSITION AT DISCHARGE:  Satisfactory.                                              Gerri Spore B. Earlene Plater, M.D.   WBD/MEDQ  D:  11/11/2003  T:  11/11/2003  Job:  [id number]

## 2010-12-17 NOTE — Op Note (Signed)
NAME:  Claire Medina, Claire Medina                         ACCOUNT NO.:  [id number]   MEDICAL RECORD NO.:  [id number]                   PATIENT TYPE:  INP   LOCATION:  9118                                 FACILITY:  [facility]   PHYSICIAN:  Gerri Spore B. Earlene Plater, M.D.               DATE OF BIRTH:  1970-10-10   DATE OF PROCEDURE:  10/17/2003  DATE OF DISCHARGE:  10/20/2003                                 OPERATIVE REPORT   PREOPERATIVE DIAGNOSES:  1. A 35-week intrauterine pregnancy.  2. Breech presentation.  3. Oligohydramnios.  4. Biophysical profile in the office today 0/8.   POSTOPERATIVE DIAGNOSES:  1. A 35-week intrauterine pregnancy.  2. Breech presentation.  3. Oligohydramnios.  4. Biophysical profile in the office today 0/8.   PROCEDURE:  Repeat low transverse cesarean section and bilateral tubal  ligation.   SURGEON:  Chester Holstein. Earlene Plater, M.D.   INDICATIONS FOR PROCEDURE:  The patient with a history of twin IUFD at 32  weeks with her previous pregnancy.  Has been followed as an outpatient for a  mild oligohydramnios with reassuring fetal surveillance twice weekly.  Today, in the office, the patient's biophysical profile is 0/8.  There was  absolutely no fetal movement despite external provocation.  Given the  extremely nonreassuring biophysical profile, the patient's gestational age  and severe poor previous obstetric history, I recommended we proceed with  delivery.  She has had a previous C-section and desires repeat and the fetus  is breech.  Also desires tubal ligation.  Failure rate, ectopic risk were  discussed.   FINDINGS:  Viable female, breech presentation, 6 pounds 1 ounce, Apgars 8 and  9.  Cord pH 7.35.  Normal uterus, tubes and ovaries.   ESTIMATED BLOOD LOSS:  400 mL.   SPECIMENS:  Placenta to pathology.   COMPLICATIONS:  None.   DESCRIPTION OF PROCEDURE:  The patient is taken to the operating room and  spinal anesthesia obtained. She was prepped and draped in standard  fashion  and a Foley catheter inserted into the bladder.   A Pfannenstiel incision was made and carried sharply to the fascia.  The  fascia was divided sharply.  The rectus muscles were divided off sharply and  the posterior sheath and peritoneum entered sharply.  Incision extended  inferiorly with good visualization of surrounding organs.   Bladder blade inserted and the bladder flap created with sharp technique.  Uterine incision made in low transverse fashion with a knife and extended  laterally with bandage scissors.  Clear fluid noted at amniotomy.   The breech was elevated through the incision and delivered with fundal  pressure.  The legs were delivered by flexion of the hip.  Traction was  placed on the iliac crest and the infant was delivered to the scapulae.  Each arm was delivered by flexion at the elbow.  The head was delivered by  flexion of the neck.  The nose and mouth suctioned with a bulb.  Cord  clamped and cut and the infant handed off to awaiting pediatricians.  One  gram of Ancef was given at cord clamp.   Uterus was removed manually, noted to be somewhat adherent but was removed  without difficulty.  There was no bleeding on the placental bed thereafter.  The uterus was exteriorized and cleared of all clots and debris.  The  incision was free of extension.   The uterine incision was closed in a running locked stitch of 0 chromic.  Two bleeders in the midline were made hemostatic with figure-of-eight stitch  of the same suture.   The right tube was identified, followed out to its fimbriated end.  It was  grasped 2 cm distal to the cornu and a 2 cm segment was tied with plain  suture and excised sharply.  Repeated on the opposite side in the same  fashion.  Hemostasis obtained.   The uterus was returned to the abdomen and the pelvis irrigated.  The  uterine incision, tubal incisions and bladder flap and subfascial spaces  were all hemostatic.   The fascia was  closed of running stitch of 0 Vicryl.  The subcutaneous  tissue was irrigated and made hemostatic with the Bovie.  Skin was closed  with staples.   The patient tolerated the procedure well and there were no complications.  She was taken to the recovery room awake, alert and in stable condition.  All counts correct per the operating room staff.                                               Gerri Spore B. Earlene Plater, M.D.    WBD/MEDQ  D:  10/17/2003  T:  10/20/2003  Job:  [id number]

## 2011-05-03 ENCOUNTER — Other Ambulatory Visit: Payer: Self-pay | Admitting: Obstetrics and Gynecology

## 2011-05-03 DIAGNOSIS — Z1231 Encounter for screening mammogram for malignant neoplasm of breast: Secondary | ICD-10-CM

## 2011-06-03 ENCOUNTER — Ambulatory Visit
Admission: RE | Admit: 2011-06-03 | Discharge: 2011-06-03 | Disposition: A | Discharge status: Home / Self Care | Payer: Private Health Insurance - Indemnity | Source: Ambulatory Visit | Attending: Obstetrics and Gynecology | Admitting: Obstetrics and Gynecology

## 2011-06-03 DIAGNOSIS — Z1231 Encounter for screening mammogram for malignant neoplasm of breast: Secondary | ICD-10-CM

## 2012-05-22 ENCOUNTER — Other Ambulatory Visit: Payer: Self-pay | Admitting: Obstetrics and Gynecology

## 2012-05-22 DIAGNOSIS — Z1231 Encounter for screening mammogram for malignant neoplasm of breast: Secondary | ICD-10-CM

## 2012-06-27 ENCOUNTER — Ambulatory Visit: Payer: Private Health Insurance - Indemnity

## 2012-07-30 ENCOUNTER — Ambulatory Visit
Admission: RE | Admit: 2012-07-30 | Discharge: 2012-07-30 | Disposition: A | Discharge status: Home / Self Care | Payer: Medicare HMO | Source: Ambulatory Visit | Attending: Obstetrics and Gynecology | Admitting: Obstetrics and Gynecology

## 2012-07-30 DIAGNOSIS — Z1231 Encounter for screening mammogram for malignant neoplasm of breast: Secondary | ICD-10-CM

## 2013-10-04 ENCOUNTER — Other Ambulatory Visit: Payer: Self-pay

## 2013-10-04 DIAGNOSIS — Z1231 Encounter for screening mammogram for malignant neoplasm of breast: Secondary | ICD-10-CM

## 2013-12-05 ENCOUNTER — Ambulatory Visit
Admission: RE | Admit: 2013-12-05 | Discharge: 2013-12-05 | Disposition: A | Discharge status: Home / Self Care | Payer: Private Health Insurance - Indemnity | Source: Ambulatory Visit

## 2013-12-05 DIAGNOSIS — Z1231 Encounter for screening mammogram for malignant neoplasm of breast: Secondary | ICD-10-CM

## 2016-03-02 ENCOUNTER — Other Ambulatory Visit: Payer: Self-pay | Admitting: Obstetrics and Gynecology

## 2016-03-02 DIAGNOSIS — N63 Unspecified lump in unspecified breast: Secondary | ICD-10-CM

## 2016-03-07 ENCOUNTER — Other Ambulatory Visit: Payer: Self-pay | Admitting: Obstetrics and Gynecology

## 2016-03-07 ENCOUNTER — Ambulatory Visit
Admission: RE | Admit: 2016-03-07 | Discharge: 2016-03-07 | Disposition: A | Discharge status: Home / Self Care | Payer: Managed Care, Other (non HMO) | Source: Ambulatory Visit | Attending: Obstetrics and Gynecology | Admitting: Obstetrics and Gynecology

## 2016-03-07 ENCOUNTER — Ambulatory Visit (HOSPITAL_COMMUNITY)
Admission: RE | Admit: 2016-03-07 | Discharge: 2016-03-07 | Disposition: A | Discharge status: Home / Self Care | Payer: Managed Care, Other (non HMO) | Source: Ambulatory Visit | Attending: Obstetrics and Gynecology | Admitting: Obstetrics and Gynecology

## 2016-03-07 DIAGNOSIS — N63 Unspecified lump in unspecified breast: Secondary | ICD-10-CM

## 2019-08-14 DIAGNOSIS — Z124 Encounter for screening for malignant neoplasm of cervix: Secondary | ICD-10-CM | POA: Diagnosis not present

## 2019-08-14 DIAGNOSIS — Z01419 Encounter for gynecological examination (general) (routine) without abnormal findings: Secondary | ICD-10-CM | POA: Diagnosis not present

## 2019-08-14 DIAGNOSIS — Z1151 Encounter for screening for human papillomavirus (HPV): Secondary | ICD-10-CM | POA: Diagnosis not present

## 2019-08-14 DIAGNOSIS — Z6834 Body mass index (BMI) 34.0-34.9, adult: Secondary | ICD-10-CM | POA: Diagnosis not present

## 2019-08-14 DIAGNOSIS — Z1231 Encounter for screening mammogram for malignant neoplasm of breast: Secondary | ICD-10-CM | POA: Diagnosis not present

## 2019-08-20 DIAGNOSIS — K219 Gastro-esophageal reflux disease without esophagitis: Secondary | ICD-10-CM | POA: Diagnosis not present

## 2019-08-20 DIAGNOSIS — M545 Low back pain: Secondary | ICD-10-CM | POA: Diagnosis not present

## 2019-08-20 DIAGNOSIS — E785 Hyperlipidemia, unspecified: Secondary | ICD-10-CM | POA: Diagnosis not present

## 2019-08-20 DIAGNOSIS — I10 Essential (primary) hypertension: Secondary | ICD-10-CM | POA: Diagnosis not present

## 2019-09-03 DIAGNOSIS — E669 Obesity, unspecified: Secondary | ICD-10-CM | POA: Diagnosis not present

## 2019-09-03 DIAGNOSIS — Z1211 Encounter for screening for malignant neoplasm of colon: Secondary | ICD-10-CM | POA: Diagnosis not present

## 2019-09-16 DIAGNOSIS — K5730 Diverticulosis of large intestine without perforation or abscess without bleeding: Secondary | ICD-10-CM | POA: Diagnosis not present

## 2019-09-16 DIAGNOSIS — Z1211 Encounter for screening for malignant neoplasm of colon: Secondary | ICD-10-CM | POA: Diagnosis not present

## 2019-10-17 DIAGNOSIS — K219 Gastro-esophageal reflux disease without esophagitis: Secondary | ICD-10-CM | POA: Diagnosis not present

## 2019-10-17 DIAGNOSIS — I10 Essential (primary) hypertension: Secondary | ICD-10-CM | POA: Diagnosis not present

## 2019-10-17 DIAGNOSIS — E785 Hyperlipidemia, unspecified: Secondary | ICD-10-CM | POA: Diagnosis not present

## 2019-10-17 DIAGNOSIS — M545 Low back pain: Secondary | ICD-10-CM | POA: Diagnosis not present

## 2019-11-02 ENCOUNTER — Emergency Department (HOSPITAL_BASED_OUTPATIENT_CLINIC_OR_DEPARTMENT_OTHER)
Admission: EM | Admit: 2019-11-02 | Discharge: 2019-11-02 | Disposition: A | Discharge status: Home / Self Care | Payer: BC Managed Care – PPO | Attending: Emergency Medicine | Admitting: Emergency Medicine

## 2019-11-02 ENCOUNTER — Encounter (HOSPITAL_BASED_OUTPATIENT_CLINIC_OR_DEPARTMENT_OTHER): Payer: Self-pay | Admitting: Emergency Medicine

## 2019-11-02 ENCOUNTER — Other Ambulatory Visit: Payer: Self-pay

## 2019-11-02 DIAGNOSIS — I10 Essential (primary) hypertension: Secondary | ICD-10-CM | POA: Diagnosis not present

## 2019-11-02 DIAGNOSIS — L509 Urticaria, unspecified: Secondary | ICD-10-CM

## 2019-11-02 DIAGNOSIS — R21 Rash and other nonspecific skin eruption: Secondary | ICD-10-CM | POA: Diagnosis not present

## 2019-11-02 HISTORY — DX: Essential (primary) hypertension: I10

## 2019-11-02 MED ORDER — PREDNISONE 10 MG PO TABS
60.0000 mg | ORAL_TABLET | Freq: Once | ORAL | Status: AC
  Administered 2019-11-02: 60 mg via ORAL
  Filled 2019-11-02: qty 1

## 2019-11-02 MED ORDER — PREDNISONE 10 MG (21) PO TBPK: ORAL_TABLET | ORAL | 0 refills | Status: DC

## 2019-11-02 NOTE — ED Provider Notes (Signed)
TIME SEEN: 11:02 PM  CHIEF COMPLAINT: Hives  HPI: Patient is a 49 year old female with history of hypertension who presents to the emergency department with hives.  States symptoms started tonight after drinking a new tea and eating shrimp.  Took 50 mg of Benadryl and symptoms have improved but not completely resolved.  No lip or tongue swelling.  No difficulty breathing, wheezing.  No other new exposures.  She does have an allergy specialist.  She was told that she was not allergic to shrimp.  She states that she has had strep before with no problems.  She does state that the TV is something new for her.  ROS: See HPI Constitutional: no fever  Eyes: no drainage  ENT: no runny nose   Cardiovascular:  no chest pain  Resp: no SOB  GI: no vomiting GU: no dysuria Integumentary: no rash  Allergy:  hives  Musculoskeletal: no leg swelling  Neurological: no slurred speech ROS otherwise negative  PAST MEDICAL HISTORY/PAST SURGICAL HISTORY:  Past Medical History:  Diagnosis Date  . Hypertension     MEDICATIONS:  Prior to Admission medications   Not on File    ALLERGIES:  No Known Allergies  SOCIAL HISTORY:  Social History   Tobacco Use  . Smoking status: Never Smoker  Substance Use Topics  . Alcohol use: Yes    FAMILY HISTORY: History reviewed. No pertinent family history.  EXAM: BP (!) 154/103 (BP Location: Right Arm)   Pulse 74   Temp 99.1 F (37.3 C) (Oral)   Resp 18   Ht 5\' 6"  (1.676 m)   Wt 94.8 kg   LMP  (LMP Unknown)   SpO2 100%   BMI 33.73 kg/m  CONSTITUTIONAL: Alert and oriented and responds appropriately to questions. Well-appearing; well-nourished HEAD: Normocephalic EYES: Conjunctivae clear, pupils appear equal, EOM appear intact ENT: normal nose; moist mucous membranes, no angioedema, tongue sits flat in the bottom of the mouth, normal phonation, no trismus or drooling, no stridor NECK: Supple, normal ROM CARD: RRR; S1 and S2 appreciated; no murmurs,  no clicks, no rubs, no gallops RESP: Normal chest excursion without splinting or tachypnea; breath sounds clear and equal bilaterally; no wheezes, no rhonchi, no rales, no hypoxia or respiratory distress, speaking full sentences ABD/GI: Normal bowel sounds; non-distended; soft, non-tender, no rebound, no guarding, no peritoneal signs, no hepatosplenomegaly BACK:  The back appears normal EXT: Normal ROM in all joints; no deformity noted, no edema; no cyanosis SKIN: Normal color for age and race; warm; scattered urticaria to her torso and upper extremities that have significantly improved per her report, no other rash noted NEURO: Moves all extremities equally PSYCH: The patient's mood and manner are appropriate.   MEDICAL DECISION MAKING: Patient here with urticaria.  Suspect allergic reaction.  Symptoms have almost completely resolved after Benadryl at home.  Will place on steroid taper as well.  Have recommended she avoid shellfish and the tea that she drank tonight and follow-up with her allergy specialist.  At this time there is no indication for epinephrine.  We have had discussion regarding return precautions.  Patient and husband verbalized understanding.  At this time, I do not feel there is any life-threatening condition present. I have reviewed, interpreted and discussed all results (EKG, imaging, lab, urine as appropriate) and exam findings with patient/family. I have reviewed nursing notes and appropriate previous records.  I feel the patient is safe to be discharged home without further emergent workup and can continue workup as an  outpatient as needed. Discussed usual and customary return precautions. Patient/family verbalize understanding and are comfortable with this plan.  Outpatient follow-up has been provided as needed. All questions have been answered.   Claire Medina was evaluated in Emergency Department on 11/02/2019 for the symptoms described in the history of present illness. She  was evaluated in the context of the global COVID-19 pandemic, which necessitated consideration that the patient might be at risk for infection with the SARS-CoV-2 virus that causes COVID-19. Institutional protocols and algorithms that pertain to the evaluation of patients at risk for COVID-19 are in a state of rapid change based on information released by regulatory bodies including the CDC and federal and state organizations. These policies and algorithms were followed during the patient's care in the ED.      Sheika Coutts, Delice Bison, DO 11/02/19 2325

## 2019-11-02 NOTE — ED Triage Notes (Signed)
Pt c/o itchy rash on arms, chest, neck, and back. Pt reports drinking a new tea and eating shrimp approx 1 hr pta. Pt denies difficulty breathing, denies oral swelling. Pt took 50 mg diphenhydramine at 2030 tonite.

## 2019-11-02 NOTE — Discharge Instructions (Signed)
I recommend close follow-up with your allergy specialist.  You may continue Benadryl 50 mg every 8 hours as needed.  Please avoid shellfish and the tea that you drink tonight at this time.  If you develop worsening hives, lip or tongue swelling, difficulty breathing or wheezing, lightheadedness, please return to the emergency department.

## 2019-12-03 ENCOUNTER — Encounter: Payer: Self-pay | Admitting: Pediatrics

## 2019-12-03 ENCOUNTER — Other Ambulatory Visit: Payer: Self-pay

## 2019-12-03 ENCOUNTER — Ambulatory Visit (INDEPENDENT_AMBULATORY_CARE_PROVIDER_SITE_OTHER): Payer: BC Managed Care – PPO | Admitting: Pediatrics

## 2019-12-03 VITALS — BP 140/76 | HR 80 | Temp 98.3°F | Resp 16 | Ht 65.5 in | Wt 214.3 lb

## 2019-12-03 DIAGNOSIS — I10 Essential (primary) hypertension: Secondary | ICD-10-CM | POA: Diagnosis not present

## 2019-12-03 DIAGNOSIS — K219 Gastro-esophageal reflux disease without esophagitis: Secondary | ICD-10-CM

## 2019-12-03 DIAGNOSIS — T7800XD Anaphylactic reaction due to unspecified food, subsequent encounter: Secondary | ICD-10-CM | POA: Diagnosis not present

## 2019-12-03 DIAGNOSIS — J3089 Other allergic rhinitis: Secondary | ICD-10-CM | POA: Diagnosis not present

## 2019-12-03 DIAGNOSIS — Z8669 Personal history of other diseases of the nervous system and sense organs: Secondary | ICD-10-CM | POA: Diagnosis not present

## 2019-12-03 DIAGNOSIS — E669 Obesity, unspecified: Secondary | ICD-10-CM

## 2019-12-03 MED ORDER — EPINEPHRINE 0.3 MG/0.3ML IJ SOAJ: INTRAMUSCULAR | 3 refills | Status: DC

## 2019-12-03 NOTE — Progress Notes (Signed)
[address] [place] [address] Dept: [phone]  New Patient Note  Patient ID: Claire Medina, female    DOB: [birth year]  Age: 49 y.o. MRN: [id number] Date of Office Visit: 12/03/2019 Referring provider: Norm Salt, PA [address] [place],  [place] [address]    Chief Complaint: Allergic Reaction (ate shrimp and drank Mango Hibiscus Pure Leaf Herbal Tea.  Hives immediately after ingestion. generalized.)  HPI Claire Medina presents for an allergy evaluation.  On November 02, 2019 she ate several shrimp and then drank Mango Hibiscus Herbal tea by Pure Leaf and within minutes developed a burning sensation and hives.  She  went to the emergency room where she was given Benadryl and a Dosepak of prednisone.. Several years ago after eating shrimp she developed hives but further testing was negative and she has been able to eat shrimp since then.  If she is  exposed to dust , she gets a runny nose and itchy skin.  She does not have a history of seasonal allergic rhinitis, asthma, eczema or chronic urticaria.  She does not recall problems from mango.  She has not had pneumonia or sinus infections.  She has not had tick bites  Review of Systems  Constitutional: Negative.   HENT:       Runny nose from dust  Eyes:       Glaucoma  Respiratory: Negative.   Cardiovascular:       Hypertension  Gastrointestinal:       Gastroesophageal reflux  Genitourinary:       History of a ruptured placenta with subsequent dialysis for about a year.  Musculoskeletal: Negative.   Skin:       Hives from shrimp several years ago  Neurological: Negative.   Endo/Heme/Allergies:       No diabetes or thyroid disease  Psychiatric/Behavioral: Negative.     Outpatient Encounter Medications as of 12/03/2019  Medication Sig  . amLODipine (NORVASC) 5 MG tablet Take 5 mg by mouth daily.  . hydrochlorothiazide (HYDRODIURIL) 25 MG tablet Take 25 mg by mouth daily.  Marland Kitchen omeprazole (PRILOSEC) 20 MG  capsule Take 20 mg by mouth daily.  . timolol (TIMOPTIC) 0.5 % ophthalmic solution 1 drop 2 (two) times daily.  Marland Kitchen tiZANidine (ZANAFLEX) 4 MG tablet Take 4 mg by mouth every 8 (eight) hours as needed.  . Vitamin D, Ergocalciferol, (DRISDOL) 1.25 MG (50000 UNIT) CAPS capsule Take 50,000 Units by mouth once a week.  Marland Kitchen EPINEPHrine (AUVI-Q) 0.3 mg/0.3 mL IJ SOAJ injection Use as directed for severe allergic reaction  . predniSONE (STERAPRED UNI-PAK 21 TAB) 10 MG (21) TBPK tablet Take as directed (Patient not taking: Reported on 12/03/2019)   No facility-administered encounter medications on file as of 12/03/2019.     Drug Allergies:  No Known Allergies  Family History: Tysheka's family history includes Allergic rhinitis in her son; Asthma in her son..  Family history is negative for sinus problems, angioedema, eczema, hives, food allergies, lupus, chronic bronchitis or emphysema.  Social and environmental.  There are no pets in the home.  She is not exposed to cigarette smoking.  She has not smoked cigarettes in the past.  She works out of her home.  Physical Exam: BP 140/76 (BP Location: Right Arm, Patient Position: Sitting, Cuff Size: Normal)   Pulse 80   Temp 98.3 F (36.8 C) (Oral)   Resp 16   Ht 5' 5.5" (1.664 m)   Wt 214 lb 4.6 oz (97.2  kg)   SpO2 99%   BMI 35.12 kg/m    Physical Exam Vitals reviewed.  Constitutional:      Appearance: Normal appearance. She is obese.  HENT:     Head:     Comments: Eyes normal.  Ears normal.  Nose mild swelling nasal turbinates.  Pharynx normal. Neck:     Comments: No thyromegaly Cardiovascular:     Rate and Rhythm: Normal rate and regular rhythm.     Comments: S1-S2 normal no murmurs Pulmonary:     Comments: Clear to percussion and auscultation Abdominal:     Palpations: Abdomen is soft.     Tenderness: There is no abdominal tenderness.     Comments: No hepatosplenomegaly  Musculoskeletal:     Cervical back: Neck supple.  Lymphadenopathy:      Cervical: No cervical adenopathy.  Skin:    Comments: Clear  Neurological:     General: No focal deficit present.     Mental Status: She is alert and oriented to person, place, and time. Mental status is at baseline.  Psychiatric:        Mood and Affect: Mood normal.        Behavior: Behavior normal.        Thought Content: Thought content normal.        Judgment: Judgment normal.     Diagnostics:  Allergy skin test were positive to dust mite.  Skin testing to shellfish and the herbal tea  was negative.Luretha Rued not available for skin testing  Assessment  Assessment and Plan: 1. Anaphylactic reaction due to food, subsequent encounter   2. Other allergic rhinitis   3. Essential hypertension   4. History of glaucoma   5. Gastroesophageal reflux disease without esophagitis   6. Obesity (BMI 35.0-39.9 without comorbidity)     Meds ordered this encounter  Medications  . EPINEPHrine (AUVI-Q) 0.3 mg/0.3 mL IJ SOAJ injection    Sig: Use as directed for severe allergic reaction    Dispense:  2 each    Refill:  3    Dispense one carton of 2 devices plus trainer.    Patient Instructions  Environmental control of dust mite Zyrtec 10 mg-take 1 tablet once a day if needed for runny nose or itching  Avoid shellfish, mango and the Mango Hibiscus Herbal Tea by Pure Leaf.  If you have an allergic reaction take Benadryl 50 mg every 4 hours and if you have life-threatening symptoms inject with Auvi-Q 0.3 mg.  Then write what you had to eat or drink in the previous 4 hours  I will call you with the results of your blood work for allergies to mango and shellfish  Let us know if you have another allergic reaction   Return if symptoms worsen or fail to improve.   Thank you for the opportunity to care for this patient.  Please do not hesitate to contact me with questions.  Penne Lash, M.D.  [facility] [address] [place], [place]  [address] [phone]

## 2019-12-03 NOTE — Patient Instructions (Addendum)
Environmental control of dust mite Zyrtec 10 mg-take 1 tablet once a day if needed for runny nose or itching  Avoid shellfish, mango and the Mango Hibiscus Herbal Tea by Pure Leaf.  If you have an allergic reaction take Benadryl 50 mg every 4 hours and if you have life-threatening symptoms inject with Auvi-Q 0.3 mg.  Then write what you had to eat or drink in the previous 4 hours  I will call you with the results of your blood work for allergies to mango and shellfish  Let us know if you have another allergic reaction

## 2019-12-07 LAB — ALLERGEN PROFILE, SHELLFISH
Clam IgE: <0.1 kU/L
F023-IgE Crab: <0.1 kU/L
F080-IgE Lobster: <0.1 kU/L
F290-IgE Oyster: <0.1 kU/L
Scallop IgE: <0.1 kU/L
Shrimp IgE: <0.1 kU/L

## 2019-12-07 LAB — ALLERGEN, MANGO, F91: Mango IgE: <0.1 kU/L

## 2019-12-18 NOTE — Progress Notes (Addendum)
[address] [place] [address] Dept: [phone]  FOLLOW UP NOTE  Patient ID: Claire Medina, female    DOB: February 24, 1971  Age: 49 y.o. MRN: [id number] Date of Office Visit: 12/19/2019  Assessment  Chief Complaint: Allergy Testing (shrimp challenge)  HPI Claire Medina is a 50 year old female that presents today for food challenge to shrimp.  She was last seen Dec 03, 2019 by Dr. Shaune Leeks for anaphylactic reaction due to food and allergic rhinitis..    In the interim she has been avoiding shellfish, mango and Mango Hibiscus Herbal Tea by Pure Leaf.  She has not had any accidental ingestion or use of epinephrine autoinjector.  She has been off all antihistamines for 3 days and denies any problems with rashes, hives, abdominal pain, or respiratory problems.  Allergic rhinitis is reported as well controlled.  She denies any rhinorrhea, nasal congestion, and postnasal drip.  She reports occasional sneezing. Current medications are listed in the chart.  Drug Allergies:  No Known Allergies  Physical Exam: BP 124/72 (BP Location: Right Arm, Patient Position: Sitting, Cuff Size: Normal)   Pulse 70   Temp 98.2 F (36.8 C) (Oral)   Resp 18   SpO2 99%    Physical Exam Constitutional:      Appearance: Normal appearance.  HENT:     Head: Normocephalic and atraumatic.     Right Ear: Tympanic membrane, ear canal and external ear normal.     Left Ear: Tympanic membrane, ear canal and external ear normal.     Ears:     Comments: Pharynx normal. Eyes normal. Nose: minimal swelling of turbinates. Ears normal.    Mouth/Throat:     Mouth: Mucous membranes are moist.     Pharynx: Oropharynx is clear.  Eyes:     Conjunctiva/sclera: Conjunctivae normal.  Cardiovascular:     Rate and Rhythm: Normal rate and regular rhythm.     Heart sounds: Normal heart sounds.  Pulmonary:     Effort: Pulmonary effort is normal.     Breath sounds: Normal breath sounds.     Comments: Lungs clear  to auscultation. Musculoskeletal:     Cervical back: Neck supple.  Skin:    General: Skin is warm and dry.  Neurological:     Mental Status: She is alert and oriented to person, place, and time.  Psychiatric:        Mood and Affect: Mood normal.        Behavior: Behavior normal.        Thought Content: Thought content normal.        Judgment: Judgment normal.     Procedure Note  Open graded shrimp oral challenge: The patient was able to tolerate the challenge today without adverse signs or symptoms. Vital signs were stable throughout the challenge and observation period. She received multiple doses separated by 20 minutes, each of which was separated by vitals and a brief physical exam. She received the following doses: lip rub, approximately 3 gm, 6 gm, 12 gm, 24 gm, and 37 gm. She was monitored for 60 minutes following the last dose.   The patient had negative skin prick test and sIgE tests to shrimp and was able to tolerate the open graded oral challenge today without adverse signs or symptoms. Therefore, she has the same risk of systemic reaction associated with the consumption of shrimp as the general population.    Assessment and Plan: 1. Anaphylactic reaction due to food, subsequent encounter  2. Other allergic rhinitis   3. Essential hypertension   4. History of glaucoma   5. Gastroesophageal reflux disease without esophagitis     No orders of the defined types were placed in this encounter.   Patient Instructions  Claire Medina was able to tolerate the shrimp food challenge today at the office without adverse signs or symptoms of an allergic reaction. Therefore, she has the same risk of systemic reaction associated with the consumption of shrimp products as the general population.  - Do not give any shrimp and shellfish products for the next 24 hours. - Monitor for allergic symptoms such as rash, wheezing, diarrhea, swelling, and vomiting for the next 24 hours. If severe symptoms  occur, treat with EpiPen injection and call 911. For less severe symptoms treat with Benadryl 4 teaspoonfuls every 4 hours and call the clinic.  - If no allergic symptoms are evident, reintroduce shrimp products into the diet, 1-2 servings a week. If you develop an allergic reaction to shrimp products, record what was eaten the amount eaten, preparation method, time from ingestion to reaction, and symptoms.   Continue to avoid mango and Mango Hibiscus Herbal Tea by Pure Leaf. Please let us know if this treatment plan is not working well for you.  Schedule follow up appointment in 2- 3 months.   Return in about 2 months (around 02/18/2020), or if symptoms worsen or fail to improve.   You for the opportunity to care for this patient.  Please do not hesitate to contact me with questions.  Nehemiah Settle, FNP [facility]  ________________________________________________  I have provided oversight concerning [name]'s evaluation and treatment of this patient's health issues addressed during today's encounter.  I agree with the assessment and therapeutic plan as outlined in the note.   Signed,   R Jorene Guest, MD

## 2019-12-19 ENCOUNTER — Encounter: Payer: Self-pay | Admitting: Family Medicine

## 2019-12-19 ENCOUNTER — Ambulatory Visit: Payer: BC Managed Care – PPO | Admitting: Family Medicine

## 2019-12-19 ENCOUNTER — Other Ambulatory Visit: Payer: Self-pay

## 2019-12-19 VITALS — BP 124/72 | HR 70 | Temp 98.2°F | Resp 18

## 2019-12-19 DIAGNOSIS — I10 Essential (primary) hypertension: Secondary | ICD-10-CM

## 2019-12-19 DIAGNOSIS — T7800XD Anaphylactic reaction due to unspecified food, subsequent encounter: Secondary | ICD-10-CM

## 2019-12-19 DIAGNOSIS — Z8669 Personal history of other diseases of the nervous system and sense organs: Secondary | ICD-10-CM

## 2019-12-19 DIAGNOSIS — K219 Gastro-esophageal reflux disease without esophagitis: Secondary | ICD-10-CM

## 2019-12-19 DIAGNOSIS — J3089 Other allergic rhinitis: Secondary | ICD-10-CM

## 2019-12-19 NOTE — Patient Instructions (Addendum)
Claire Medina was able to tolerate the shrimp food challenge today at the office without adverse signs or symptoms of an allergic reaction. Therefore, she has the same risk of systemic reaction associated with the consumption of shrimp products as the general population.  - Do not give any shrimp and shellfish products for the next 24 hours. - Monitor for allergic symptoms such as rash, wheezing, diarrhea, swelling, and vomiting for the next 24 hours. If severe symptoms occur, treat with EpiPen injection and call 911. For less severe symptoms treat with Benadryl 4 teaspoonfuls every 4 hours and call the clinic.  - If no allergic symptoms are evident, reintroduce shrimp products into the diet, 1-2 servings a week. If you develop an allergic reaction to shrimp products, record what was eaten the amount eaten, preparation method, time from ingestion to reaction, and symptoms.   Continue to avoid mango and Mango Hibiscus Herbal Tea by Pure Leaf. Please let us know if this treatment plan is not working well for you.  Schedule follow up appointment in 2- 3 months.

## 2019-12-26 DIAGNOSIS — H2513 Age-related nuclear cataract, bilateral: Secondary | ICD-10-CM | POA: Diagnosis not present

## 2019-12-26 DIAGNOSIS — H401132 Primary open-angle glaucoma, bilateral, moderate stage: Secondary | ICD-10-CM | POA: Diagnosis not present

## 2020-01-22 DIAGNOSIS — H401132 Primary open-angle glaucoma, bilateral, moderate stage: Secondary | ICD-10-CM | POA: Diagnosis not present

## 2020-01-22 DIAGNOSIS — H2513 Age-related nuclear cataract, bilateral: Secondary | ICD-10-CM | POA: Diagnosis not present

## 2020-02-11 DIAGNOSIS — E785 Hyperlipidemia, unspecified: Secondary | ICD-10-CM | POA: Diagnosis not present

## 2020-02-11 DIAGNOSIS — G2581 Restless legs syndrome: Secondary | ICD-10-CM | POA: Diagnosis not present

## 2020-02-11 DIAGNOSIS — K219 Gastro-esophageal reflux disease without esophagitis: Secondary | ICD-10-CM | POA: Diagnosis not present

## 2020-02-11 DIAGNOSIS — I10 Essential (primary) hypertension: Secondary | ICD-10-CM | POA: Diagnosis not present

## 2020-03-23 ENCOUNTER — Ambulatory Visit: Payer: BC Managed Care – PPO | Admitting: Allergy and Immunology

## 2020-05-12 DIAGNOSIS — K219 Gastro-esophageal reflux disease without esophagitis: Secondary | ICD-10-CM | POA: Diagnosis not present

## 2020-05-12 DIAGNOSIS — R1013 Epigastric pain: Secondary | ICD-10-CM | POA: Diagnosis not present

## 2020-05-12 DIAGNOSIS — I10 Essential (primary) hypertension: Secondary | ICD-10-CM | POA: Diagnosis not present

## 2020-05-12 DIAGNOSIS — M7062 Trochanteric bursitis, left hip: Secondary | ICD-10-CM | POA: Diagnosis not present

## 2020-06-02 ENCOUNTER — Other Ambulatory Visit: Payer: Self-pay

## 2020-06-02 ENCOUNTER — Encounter: Payer: Self-pay | Admitting: Allergy and Immunology

## 2020-06-02 ENCOUNTER — Ambulatory Visit: Payer: BC Managed Care – PPO | Admitting: Allergy and Immunology

## 2020-06-02 DIAGNOSIS — T7800XD Anaphylactic reaction due to unspecified food, subsequent encounter: Secondary | ICD-10-CM

## 2020-06-02 DIAGNOSIS — T7800XA Anaphylactic reaction due to unspecified food, initial encounter: Secondary | ICD-10-CM

## 2020-06-02 NOTE — Assessment & Plan Note (Signed)
   Continue careful avoidance of mango and have access to epinephrine autoinjector 2 pack in case of accidental ingestion.

## 2020-06-02 NOTE — Progress Notes (Signed)
    Follow-up Note  RE: Claire Medina MRN: [id number] DOB: 1971/04/21 Date of Office Visit: 06/02/2020  Primary care provider: Norm Salt, PA Referring provider: Norm Salt, PA  History of present illness: Claire Medina is a 49 y.o. female with food allergy presenting today for follow-up.  She was last seen in this clinic for a prescription open graded oral challenge on Dec 19, 2019.  She passed the challenge.  However, she states that she is still a bit hesitant to consume shrimp.  She has eaten a small amount without symptoms.  She continues to carefully avoid mango and has access to epinephrine autoinjectors.  She has no new problems or complaints today.  She states that she does not need a refill for epinephrine autoinjectors.   Assessment and plan: Food allergy  Continue careful avoidance of mango and have access to epinephrine autoinjector 2 pack in case of accidental ingestion.   Physical examination: Blood pressure 124/70, pulse 88, temperature 98.1 F (36.7 C), temperature source Tympanic, resp. rate 12, height 5\' 5"  (1.651 m), weight 224 lb (101.6 kg), SpO2 98 %.  General: Alert, interactive, in no acute distress. Neck: Supple without lymphadenopathy. Lungs: Clear to auscultation without wheezing, rhonchi or rales. CV: Normal S1, S2 without murmurs. Skin: Warm and dry, without lesions or rashes.  The following portions of the patient's history were reviewed and updated as appropriate: allergies, current medications, past family history, past medical history, past social history, past surgical history and problem list.  Current Outpatient Medications  Medication Sig Dispense Refill  . amLODipine (NORVASC) 5 MG tablet Take 5 mg by mouth daily.    . diclofenac (VOLTAREN) 75 MG EC tablet Take 75 mg by mouth 2 (two) times daily.    EPINEPHrine (AUVI-Q) 0.3 mg/0.3 mL IJ SOAJ injection Use as directed for severe allergic reaction 2 each 3  .  hydrochlorothiazide (HYDRODIURIL) 25 MG tablet Take 25 mg by mouth daily.    Marland Kitchen omeprazole (PRILOSEC) 20 MG capsule Take 20 mg by mouth daily.    . timolol (TIMOPTIC) 0.5 % ophthalmic solution 1 drop 2 (two) times daily.    Marland Kitchen tiZANidine (ZANAFLEX) 4 MG tablet Take 4 mg by mouth every 8 (eight) hours as needed.    . Vitamin D, Ergocalciferol, (DRISDOL) 1.25 MG (50000 UNIT) CAPS capsule Take 50,000 Units by mouth once a week.     No current facility-administered medications for this visit.    Allergies  Allergen Reactions  . Other Hives    Mango Hisbiscus Tea    I appreciate the opportunity to take part in Casady's care. Please do not hesitate to contact me with questions.  Sincerely,   R. Marland Kitchen, MD

## 2020-06-02 NOTE — Patient Instructions (Addendum)
Food allergy  Continue careful avoidance of mango and have access to epinephrine autoinjector 2 pack in case of accidental ingestion.   Return in about 1 year (around 06/02/2021), or if symptoms worsen or fail to improve.

## 2020-07-13 DIAGNOSIS — K219 Gastro-esophageal reflux disease without esophagitis: Secondary | ICD-10-CM | POA: Diagnosis not present

## 2020-07-13 DIAGNOSIS — I10 Essential (primary) hypertension: Secondary | ICD-10-CM | POA: Diagnosis not present

## 2020-07-13 DIAGNOSIS — G2581 Restless legs syndrome: Secondary | ICD-10-CM | POA: Diagnosis not present

## 2020-07-13 DIAGNOSIS — E785 Hyperlipidemia, unspecified: Secondary | ICD-10-CM | POA: Diagnosis not present

## 2020-08-04 DIAGNOSIS — U071 COVID-19: Secondary | ICD-10-CM | POA: Diagnosis not present

## 2020-08-26 ENCOUNTER — Encounter: Payer: Self-pay | Admitting: Allergy

## 2020-08-26 ENCOUNTER — Other Ambulatory Visit: Payer: Self-pay

## 2020-08-26 ENCOUNTER — Ambulatory Visit (INDEPENDENT_AMBULATORY_CARE_PROVIDER_SITE_OTHER): Payer: BC Managed Care – PPO | Admitting: Allergy

## 2020-08-26 VITALS — BP 140/90 | HR 90 | Temp 98.1°F | Resp 18 | Ht 66.0 in | Wt 222.8 lb

## 2020-08-26 DIAGNOSIS — L508 Other urticaria: Secondary | ICD-10-CM | POA: Diagnosis not present

## 2020-08-26 DIAGNOSIS — T7800XD Anaphylactic reaction due to unspecified food, subsequent encounter: Secondary | ICD-10-CM | POA: Diagnosis not present

## 2020-08-26 MED ORDER — FAMOTIDINE 20 MG PO TABS
20.0000 mg | ORAL_TABLET | Freq: Two times a day (BID) | ORAL | 5 refills | Status: DC
Start: 2020-08-26 — End: 2021-06-23

## 2020-08-26 NOTE — Progress Notes (Signed)
Follow-up Note  RE: Claire Medina MRN: [id number] DOB: [birth year] Date of Office Visit: 08/26/2020   History of present illness: Claire Medina is a 50 y.o. female presenting today for acute urticaria.  She was last seen in the office on 06/02/2020 for food allergy.  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  She also states today she started feeling itchy it began after she had worn her hoodie.  She then noted the hives around her neck area.  As well as around her bra line on the sides.  She has been taking Benadryl to help.  She has not had anything new in her diet.  She does continue to avoid mango and states she is too scared to try the shrimp even after passing the shrimp challenge.  She has access to her epinephrine device.  No swelling.  No joint aches or pains, fevers.  The hives do not leave any marks or bruising once they do resolve.  Of note she tested positive for Covid on 08/01/2020 after having positive exposure.  She states she was asymptomatic at the time of testing but otherwise she did develop some congestion that is lingering. She has had 2 doses of Covid vaccine and states she is due for her booster however as she tested positive for Covid.  Review of systems: Review of Systems  Constitutional: Negative.   HENT: Negative.   Eyes: Negative.   Respiratory: Negative.   Cardiovascular: Negative.   Gastrointestinal: Negative.   Musculoskeletal: Negative.   Skin: Positive for itching and rash.  Neurological: Negative.     All other systems negative unless noted above in HPI  Past medical/social/surgical/family history have been reviewed and are unchanged unless specifically indicated below.  No changes  Medication List: Current Outpatient Medications  Medication Sig Dispense Refill   amLODipine (NORVASC) 5 MG tablet Take 5 mg by mouth daily.      EPINEPHrine (AUVI-Q) 0.3 mg/0.3 mL IJ SOAJ injection Use as directed for severe allergic reaction 2 each 3   famotidine (PEPCID) 20 MG tablet Take 1 tablet (20 mg total) by mouth 2 (two) times daily. 60 tablet 5   hydrochlorothiazide (HYDRODIURIL) 25 MG tablet Take 25 mg by mouth daily.     omeprazole (PRILOSEC) 20 MG capsule Take 20 mg by mouth daily.     timolol (TIMOPTIC) 0.5 % ophthalmic solution 1 drop 2 (two) times daily.     Vitamin D, Ergocalciferol, (DRISDOL) 1.25 MG (50000 UNIT) CAPS capsule Take 50,000 Units by mouth once a week.     diclofenac (VOLTAREN) 75 MG EC tablet Take 75 mg by mouth 2 (two) times daily. (Patient not taking: Reported on 08/26/2020)     tiZANidine (ZANAFLEX) 4 MG tablet Take 4 mg by mouth every 8 (eight) hours as needed. (Patient not taking: Reported on 08/26/2020)     No current facility-administered medications for this visit.     Known medication allergies: Allergies  Allergen Reactions   Other Hives    Mango Hisbiscus Tea     Physical examination: Blood pressure 140/90, pulse 90, temperature 98.1 F (36.7 C), resp. rate 18, height 5\' 6"  (1.676 m), weight 222 lb 12.8 oz (101.1 kg), SpO2 98 %.  General: Alert, interactive, in no acute distress. HEENT: PERRLA, TMs pearly gray, turbinates non-edematous without discharge, post-pharynx non erythematous. Neck: Supple without lymphadenopathy. Lungs:  Clear to auscultation without wheezing, rhonchi or rales. {no increased work of breathing. CV: Normal S1, S2 without murmurs. Abdomen: Nondistended, nontender. Skin: Scattered erythematous urticarial type lesions primarily located Neck and lateral sides of the chest around the bra area , nonvesicular. Extremities:  No clubbing, cyanosis or edema. Neuro:   Grossly intact.  Diagnositics/Labs: None today  Assessment and plan: Acute urticaria  - at this time etiology of hives and swelling is temperature induced (increase in body temperature).   Hives can be caused by a variety of different triggers including illness/infection, foods, medications, stings, exercise, pressure, vibrations, extremes of temperature to name a few however majority of the time there is no identifiable trigger.  If your hives become chronic (lasting > 6 weeks) then would recommend further work-up  - for hive control recommend following antihistamine regimen: Zyrtec 10mg  1 tab daily with Pepcid 20mg  1 tab daily.   If daily dosing is not effective enough then increase both medications to twice day.  If twice a day dosing is not effective then consider Xolair monthly injections which we will discuss in detail if needed  Food allergy  - continue avoidance of mango and shrimp/shellfish  - have access to self-injectable epinephrine (Epipen or AuviQ) 0.3mg  at all times  - follow emergency action plan in case of allergic reaction  Follow-up in 2-3 months or sooner if needed I appreciate the opportunity to take part in Eiliana's care. Please do not hesitate to contact me with questions.  Sincerely,   , MD Allergy/Immunology [facility]

## 2020-08-26 NOTE — Patient Instructions (Signed)
Hives  - at this time etiology of hives and swelling is temperature induced (increase in body temperature).  Hives can be caused by a variety of different triggers including illness/infection, foods, medications, stings, exercise, pressure, vibrations, extremes of temperature to name a few however majority of the time there is no identifiable trigger.  If your hives become chronic (lasting > 6 weeks) then would recommend further work-up  - for hive control recommend following antihistamine regimen: Zyrtec 10mg  1 tab daily with Pepcid 20mg  1 tab daily.   If daily dosing is not effective enough then increase both medications to twice day.  If twice a day dosing is not effective then consider Xolair monthly injections which we will discuss in detail if needed  Food allergy  - continue avoidance of mango and shrimp/shellfish  - have access to self-injectable epinephrine (Epipen or AuviQ) 0.3mg  at all times  - follow emergency action plan in case of allergic reaction  Follow-up in 2-3 months or sooner if needed

## 2020-09-03 DIAGNOSIS — R059 Cough, unspecified: Secondary | ICD-10-CM | POA: Diagnosis not present

## 2020-09-03 DIAGNOSIS — U071 COVID-19: Secondary | ICD-10-CM | POA: Diagnosis not present

## 2020-09-03 DIAGNOSIS — R062 Wheezing: Secondary | ICD-10-CM | POA: Diagnosis not present

## 2020-09-17 DIAGNOSIS — H2513 Age-related nuclear cataract, bilateral: Secondary | ICD-10-CM | POA: Diagnosis not present

## 2020-09-17 DIAGNOSIS — H401132 Primary open-angle glaucoma, bilateral, moderate stage: Secondary | ICD-10-CM | POA: Diagnosis not present

## 2020-10-08 DIAGNOSIS — Z1231 Encounter for screening mammogram for malignant neoplasm of breast: Secondary | ICD-10-CM | POA: Diagnosis not present

## 2020-10-08 DIAGNOSIS — Z01419 Encounter for gynecological examination (general) (routine) without abnormal findings: Secondary | ICD-10-CM | POA: Diagnosis not present

## 2020-10-08 DIAGNOSIS — Z6836 Body mass index (BMI) 36.0-36.9, adult: Secondary | ICD-10-CM | POA: Diagnosis not present

## 2020-10-08 DIAGNOSIS — Z01411 Encounter for gynecological examination (general) (routine) with abnormal findings: Secondary | ICD-10-CM | POA: Diagnosis not present

## 2020-10-08 DIAGNOSIS — Z124 Encounter for screening for malignant neoplasm of cervix: Secondary | ICD-10-CM | POA: Diagnosis not present

## 2020-10-16 DIAGNOSIS — E2839 Other primary ovarian failure: Secondary | ICD-10-CM | POA: Diagnosis not present

## 2020-10-16 DIAGNOSIS — Z712 Person consulting for explanation of examination or test findings: Secondary | ICD-10-CM | POA: Diagnosis not present

## 2020-10-26 DIAGNOSIS — H401132 Primary open-angle glaucoma, bilateral, moderate stage: Secondary | ICD-10-CM | POA: Diagnosis not present

## 2020-11-11 ENCOUNTER — Other Ambulatory Visit: Payer: Self-pay

## 2020-11-11 ENCOUNTER — Ambulatory Visit (INDEPENDENT_AMBULATORY_CARE_PROVIDER_SITE_OTHER): Payer: BC Managed Care – PPO | Admitting: Allergy

## 2020-11-11 ENCOUNTER — Encounter: Payer: Self-pay | Admitting: Allergy

## 2020-11-11 VITALS — BP 136/90 | HR 74 | Temp 97.8°F | Resp 16

## 2020-11-11 DIAGNOSIS — L508 Other urticaria: Secondary | ICD-10-CM

## 2020-11-11 DIAGNOSIS — T7800XD Anaphylactic reaction due to unspecified food, subsequent encounter: Secondary | ICD-10-CM

## 2020-11-11 NOTE — Progress Notes (Signed)
Follow-up Note  RE: Claire Medina MRN: [id number] DOB: [birth year] Date of Office Visit: 11/11/2020   History of present illness: Claire Medina is a 50 y.o. female presenting today for follow-up of urticaria and food allergy.  She was last seen in the office on 08/26/2020 by myself.  She has been doing well since this visit without any major health changes, surgeries or hospitalizations.  She states she has not had any further episodes of hives since the last visit.  She has been taking Zyrtec and Pepcid daily. She continues to avoid mango and shrimp/shellfish.  She is curious if she could incorporate crab back into her diet at home.  She does have access to an epinephrine device.       Review of systems: Review of Systems  Constitutional: Negative.   HENT: Negative.   Eyes: Negative.   Respiratory: Negative.   Cardiovascular: Negative.   Gastrointestinal: Negative.   Musculoskeletal: Negative.   Skin: Negative.   Neurological: Negative.     All other systems negative unless noted above in HPI  Past medical/social/surgical/family history have been reviewed and are unchanged unless specifically indicated below.  No changes  Medication List: Current Outpatient Medications  Medication Sig Dispense Refill  . amLODipine (NORVASC) 5 MG tablet Take 5 mg by mouth daily.    . cetirizine (ZYRTEC) 10 MG tablet Take 10 mg by mouth daily.    Marland Kitchen EPINEPHrine (AUVI-Q) 0.3 mg/0.3 mL IJ SOAJ injection Use as directed for severe allergic reaction 2 each 3  . famotidine (PEPCID) 20 MG tablet Take 1 tablet (20 mg total) by mouth 2 (two) times daily. 60 tablet 5  . fluticasone (FLONASE) 50 MCG/ACT nasal spray Place 1 spray into both nostrils 2 (two) times daily.    . hydrochlorothiazide (HYDRODIURIL) 25 MG tablet Take 25 mg by mouth daily.    Marland Kitchen omeprazole (PRILOSEC) 20 MG capsule Take 20 mg by mouth daily.    . timolol (TIMOPTIC) 0.5 % ophthalmic solution 1 drop 2 (two) times daily.    Marland Kitchen  tiZANidine (ZANAFLEX) 4 MG tablet Take 4 mg by mouth every 8 (eight) hours as needed.     No current facility-administered medications for this visit.     Known medication allergies: Allergies  Allergen Reactions  . Other Hives    Mango Hisbiscus Tea     Physical examination: Blood pressure 136/90, pulse 74, temperature 97.8 F (36.6 C), temperature source Temporal, resp. rate 16, SpO2 100 %.  General: Alert, interactive, in no acute distress. HEENT: TMs pearly gray, turbinates non-edematous without discharge, post-pharynx non erythematous. Neck: Supple without lymphadenopathy. Lungs: Clear to auscultation without wheezing, rhonchi or rales. {no increased work of breathing. CV: Normal S1, S2 without murmurs. Abdomen: Nondistended, nontender. Skin: Warm and dry, without lesions or rashes. Extremities:  No clubbing, cyanosis or edema. Neuro:   Grossly intact.  Diagnositics/Labs: None today  Assessment and plan: Urticaria  - doing well at this time without hives  - etiology of hives and swelling is temperature induced (increase in body temperature).  Hives can be caused by a variety of different triggers including illness/infection, foods, medications, stings, exercise, pressure, vibrations, extremes of temperature to name a few however majority of the time there is no identifiable trigger.  If your hives become chronic (lasting > 6 weeks) then would recommend further work-up  - can try medication wean as follows since you have been hive free: stop Pepcid.  After 1 week if you have  not noted any itch or hives then can stop Zyrtec.  If you note any itch or hives then resume the medication dose most recently stopped and continue.    Food allergy  - continue avoidance of mango and shrimp/shellfish  - ok for slow introduction of crab at home  - have access to self-injectable epinephrine (Epipen or AuviQ) 0.3mg  at all times  - follow emergency action plan in case of allergic  reaction  Follow-up in 6-12 months or sooner if needed I appreciate the opportunity to take part in Claire Medina's care. Please do not hesitate to contact me with questions.  Sincerely,   Margo Aye, MD Allergy/Immunology [facility]

## 2020-11-11 NOTE — Patient Instructions (Addendum)
Hives  - doing well at this time without hives  - etiology of hives and swelling is temperature induced (increase in body temperature).  Hives can be caused by a variety of different triggers including illness/infection, foods, medications, stings, exercise, pressure, vibrations, extremes of temperature to name a few however majority of the time there is no identifiable trigger.  If your hives become chronic (lasting > 6 weeks) then would recommend further work-up  - can try medication wean as follows since you have been hive free: stop Pepcid.  After 1 week if you have not noted any itch or hives then can stop Zyrtec.  If you note any itch or hives then resume the medication dose most recently stopped and continue.    Food allergy  - continue avoidance of mango and shrimp/shellfish  - ok for slow introduction of crab at home  - have access to self-injectable epinephrine (Epipen or AuviQ) 0.3mg  at all times  - follow emergency action plan in case of allergic reaction  Follow-up in 6-12 months or sooner if needed

## 2021-01-12 DIAGNOSIS — R7303 Prediabetes: Secondary | ICD-10-CM | POA: Diagnosis not present

## 2021-01-12 DIAGNOSIS — I10 Essential (primary) hypertension: Secondary | ICD-10-CM | POA: Diagnosis not present

## 2021-01-12 DIAGNOSIS — E785 Hyperlipidemia, unspecified: Secondary | ICD-10-CM | POA: Diagnosis not present

## 2021-01-12 DIAGNOSIS — Z0000 Encounter for general adult medical examination without abnormal findings: Secondary | ICD-10-CM | POA: Diagnosis not present

## 2021-01-12 DIAGNOSIS — K219 Gastro-esophageal reflux disease without esophagitis: Secondary | ICD-10-CM | POA: Diagnosis not present

## 2021-01-12 DIAGNOSIS — G2581 Restless legs syndrome: Secondary | ICD-10-CM | POA: Diagnosis not present

## 2021-05-14 DIAGNOSIS — K219 Gastro-esophageal reflux disease without esophagitis: Secondary | ICD-10-CM | POA: Diagnosis not present

## 2021-05-14 DIAGNOSIS — E785 Hyperlipidemia, unspecified: Secondary | ICD-10-CM | POA: Diagnosis not present

## 2021-05-14 DIAGNOSIS — G2581 Restless legs syndrome: Secondary | ICD-10-CM | POA: Diagnosis not present

## 2021-05-14 DIAGNOSIS — I10 Essential (primary) hypertension: Secondary | ICD-10-CM | POA: Diagnosis not present

## 2021-06-23 ENCOUNTER — Other Ambulatory Visit: Payer: Self-pay | Admitting: Allergy

## 2021-07-14 DIAGNOSIS — I10 Essential (primary) hypertension: Secondary | ICD-10-CM | POA: Diagnosis not present

## 2021-07-14 DIAGNOSIS — K219 Gastro-esophageal reflux disease without esophagitis: Secondary | ICD-10-CM | POA: Diagnosis not present

## 2021-07-14 DIAGNOSIS — R5383 Other fatigue: Secondary | ICD-10-CM | POA: Diagnosis not present

## 2021-07-14 DIAGNOSIS — E785 Hyperlipidemia, unspecified: Secondary | ICD-10-CM | POA: Diagnosis not present

## 2022-03-12 ENCOUNTER — Other Ambulatory Visit: Payer: Self-pay | Admitting: Obstetrics and Gynecology

## 2022-03-30 ENCOUNTER — Encounter (HOSPITAL_BASED_OUTPATIENT_CLINIC_OR_DEPARTMENT_OTHER): Payer: Self-pay | Admitting: Obstetrics and Gynecology

## 2022-03-30 NOTE — Progress Notes (Signed)
Spoke w/ via phone for pre-op interview--- patient Lab needs dos---- already has MD order for CBC, no pregnancy test per MD; EKG, Istat              Lab results------ n/a COVID test -----patient states asymptomatic no test needed Arrive at ------- 1100 on 04/15/22 NPO after MN NO Solid Food.  Clear liquids from MN until--- 1000 on 04/15/22 Med rec completed Medications to take morning of surgery ----- amlodipine, timolol eye gtt, pepcid prn, prilosec prn, zyrtec prn, flonase prn Diabetic medication ----- n/a Patient instructed no nail polish to be worn day of surgery Patient instructed to bring photo id and insurance card day of surgery Patient aware to have Driver (ride ) / caregiver    for 24 hours after surgery - Solae Norling (husband) [phone] Patient Special Instructions ----- n/a Pre-Op special Istructions ----- n/a Patient verbalized understanding of instructions that were given at this phone interview. Patient denies shortness of breath, chest pain, fever, cough at this phone interview.  Frances Furbish, RN

## 2022-04-15 ENCOUNTER — Encounter (HOSPITAL_BASED_OUTPATIENT_CLINIC_OR_DEPARTMENT_OTHER)
Admission: RE | Disposition: A | Discharge status: Home / Self Care | Payer: Self-pay | Source: Home / Self Care | Attending: Obstetrics and Gynecology

## 2022-04-15 ENCOUNTER — Ambulatory Visit (HOSPITAL_BASED_OUTPATIENT_CLINIC_OR_DEPARTMENT_OTHER): Payer: BC Managed Care – PPO | Admitting: Anesthesiology

## 2022-04-15 ENCOUNTER — Other Ambulatory Visit: Payer: Self-pay

## 2022-04-15 ENCOUNTER — Encounter (HOSPITAL_BASED_OUTPATIENT_CLINIC_OR_DEPARTMENT_OTHER): Payer: Self-pay | Admitting: Obstetrics and Gynecology

## 2022-04-15 ENCOUNTER — Ambulatory Visit (HOSPITAL_BASED_OUTPATIENT_CLINIC_OR_DEPARTMENT_OTHER)
Admission: RE | Admit: 2022-04-15 | Discharge: 2022-04-15 | Disposition: A | Discharge status: Home / Self Care | Payer: BC Managed Care – PPO | Attending: Obstetrics and Gynecology | Admitting: Obstetrics and Gynecology

## 2022-04-15 DIAGNOSIS — I10 Essential (primary) hypertension: Secondary | ICD-10-CM | POA: Diagnosis not present

## 2022-04-15 DIAGNOSIS — N950 Postmenopausal bleeding: Secondary | ICD-10-CM | POA: Diagnosis present

## 2022-04-15 DIAGNOSIS — E669 Obesity, unspecified: Secondary | ICD-10-CM | POA: Diagnosis not present

## 2022-04-15 DIAGNOSIS — N840 Polyp of corpus uteri: Secondary | ICD-10-CM | POA: Diagnosis not present

## 2022-04-15 DIAGNOSIS — Z6836 Body mass index (BMI) 36.0-36.9, adult: Secondary | ICD-10-CM | POA: Diagnosis not present

## 2022-04-15 DIAGNOSIS — R9389 Abnormal findings on diagnostic imaging of other specified body structures: Secondary | ICD-10-CM | POA: Diagnosis present

## 2022-04-15 HISTORY — DX: Sciatica, unspecified side: M54.30

## 2022-04-15 HISTORY — PX: DILATATION & CURETTAGE/HYSTEROSCOPY WITH MYOSURE: SHX6511

## 2022-04-15 LAB — CBC
HCT: 43.4 % (ref 36.0–46.0)
Hemoglobin: 14 g/dL (ref 12.0–15.0)
MCH: 28.6 pg (ref 26.0–34.0)
MCHC: 32.3 g/dL (ref 30.0–36.0)
MCV: 88.6 fL (ref 80.0–100.0)
Platelets: 240 10*3/uL (ref 150–400)
RBC: 4.9 MIL/uL (ref 3.87–5.11)
RDW: 14.1 % (ref 11.5–15.5)
WBC: 3.4 10*3/uL — ABNORMAL LOW (ref 4.0–10.5)
nRBC: 0 % (ref 0.0–0.2)

## 2022-04-15 LAB — POCT I-STAT, CHEM 8
BUN: 9 mg/dL (ref 6–20)
Calcium, Ion: 0.94 mmol/L — ABNORMAL LOW (ref 1.15–1.40)
Chloride: 104 mmol/L (ref 98–111)
Creatinine, Ser: 0.9 mg/dL (ref 0.44–1.00)
Glucose, Bld: 96 mg/dL (ref 70–99)
HCT: 45 % (ref 36.0–46.0)
Hemoglobin: 15.3 g/dL — ABNORMAL HIGH (ref 12.0–15.0)
Potassium: 3.9 mmol/L (ref 3.5–5.1)
Sodium: 139 mmol/L (ref 135–145)
TCO2: 28 mmol/L (ref 22–32)

## 2022-04-15 SURGERY — DILATATION & CURETTAGE/HYSTEROSCOPY WITH MYOSURE
Anesthesia: General | Site: Uterus

## 2022-04-15 MED ORDER — SODIUM CHLORIDE 0.9 % IR SOLN
Status: DC | PRN
  Administered 2022-04-15: 1

## 2022-04-15 MED ORDER — MIDAZOLAM HCL 5 MG/5ML IJ SOLN
INTRAMUSCULAR | Status: DC | PRN
  Administered 2022-04-15: 2 mg via INTRAVENOUS

## 2022-04-15 MED ORDER — OXYCODONE HCL 5 MG/5ML PO SOLN: 5.0000 mg | Freq: Once | ORAL | Status: DC | PRN

## 2022-04-15 MED ORDER — ARTIFICIAL TEARS OPHTHALMIC OINT
TOPICAL_OINTMENT | OPHTHALMIC | Status: AC
  Filled 2022-04-15: qty 3.5

## 2022-04-15 MED ORDER — KETOROLAC TROMETHAMINE 30 MG/ML IJ SOLN
INTRAMUSCULAR | Status: DC | PRN
  Administered 2022-04-15: 30 mg via INTRAVENOUS

## 2022-04-15 MED ORDER — FENTANYL CITRATE (PF) 100 MCG/2ML IJ SOLN
INTRAMUSCULAR | Status: AC
  Filled 2022-04-15: qty 2

## 2022-04-15 MED ORDER — LACTATED RINGERS IV SOLN: INTRAVENOUS | Status: DC

## 2022-04-15 MED ORDER — ONDANSETRON HCL 4 MG/2ML IJ SOLN
INTRAMUSCULAR | Status: DC | PRN
  Administered 2022-04-15: 4 mg via INTRAVENOUS

## 2022-04-15 MED ORDER — KETOROLAC TROMETHAMINE 30 MG/ML IJ SOLN
INTRAMUSCULAR | Status: AC
  Filled 2022-04-15: qty 1

## 2022-04-15 MED ORDER — KETOROLAC TROMETHAMINE 30 MG/ML IJ SOLN: 30.0000 mg | Freq: Once | INTRAMUSCULAR | Status: DC | PRN

## 2022-04-15 MED ORDER — MIDAZOLAM HCL 2 MG/2ML IJ SOLN
INTRAMUSCULAR | Status: AC
  Filled 2022-04-15: qty 2

## 2022-04-15 MED ORDER — FENTANYL CITRATE (PF) 100 MCG/2ML IJ SOLN
INTRAMUSCULAR | Status: DC | PRN
  Administered 2022-04-15 (×2): 50 ug via INTRAVENOUS

## 2022-04-15 MED ORDER — OXYCODONE HCL 5 MG PO TABS: 5.0000 mg | ORAL_TABLET | Freq: Once | ORAL | Status: DC | PRN

## 2022-04-15 MED ORDER — PROPOFOL 10 MG/ML IV BOLUS
INTRAVENOUS | Status: AC
  Filled 2022-04-15: qty 20

## 2022-04-15 MED ORDER — ONDANSETRON HCL 4 MG/2ML IJ SOLN
INTRAMUSCULAR | Status: AC
  Filled 2022-04-15: qty 2

## 2022-04-15 MED ORDER — LIDOCAINE 2% (20 MG/ML) 5 ML SYRINGE
INTRAMUSCULAR | Status: DC | PRN
  Administered 2022-04-15: 100 mg via INTRAVENOUS

## 2022-04-15 MED ORDER — FENTANYL CITRATE (PF) 100 MCG/2ML IJ SOLN
25.0000 ug | INTRAMUSCULAR | Status: DC | PRN
  Administered 2022-04-15: 25 ug via INTRAVENOUS

## 2022-04-15 MED ORDER — DEXAMETHASONE SODIUM PHOSPHATE 10 MG/ML IJ SOLN
INTRAMUSCULAR | Status: DC | PRN
  Administered 2022-04-15: 10 mg via INTRAVENOUS

## 2022-04-15 MED ORDER — DEXAMETHASONE SODIUM PHOSPHATE 10 MG/ML IJ SOLN
INTRAMUSCULAR | Status: AC
  Filled 2022-04-15: qty 1

## 2022-04-15 MED ORDER — PROPOFOL 10 MG/ML IV BOLUS
INTRAVENOUS | Status: DC | PRN
  Administered 2022-04-15: 200 mg via INTRAVENOUS

## 2022-04-15 MED ORDER — POVIDONE-IODINE 10 % EX SWAB: 2.0000 | Freq: Once | CUTANEOUS | Status: DC

## 2022-04-15 MED ORDER — ONDANSETRON HCL 4 MG/2ML IJ SOLN: 4.0000 mg | Freq: Once | INTRAMUSCULAR | Status: DC | PRN

## 2022-04-15 SURGICAL SUPPLY — 12 items
CATH ROBINSON RED A/P 16FR (CATHETERS) IMPLANT
GLOVE BIOGEL PI IND STRL 7.0 (GLOVE) ×1 IMPLANT
GLOVE ECLIPSE 6.5 STRL STRAW (GLOVE) ×1 IMPLANT
GOWN STRL REUS W/TWL LRG LVL3 (GOWN DISPOSABLE) ×1 IMPLANT
IV NS IRRIG 3000ML ARTHROMATIC (IV SOLUTION) ×1 IMPLANT
KIT PROCEDURE FLUENT (KITS) ×1 IMPLANT
KIT TURNOVER CYSTO (KITS) ×1 IMPLANT
PACK VAGINAL MINOR WOMEN LF (CUSTOM PROCEDURE TRAY) ×1 IMPLANT
PAD OB MATERNITY 4.3X12.25 (PERSONAL CARE ITEMS) ×1 IMPLANT
PAD PREP 24X48 CUFFED NSTRL (MISCELLANEOUS) ×1 IMPLANT
SEAL ROD LENS SCOPE MYOSURE (ABLATOR) ×1 IMPLANT
TOWEL OR 17X26 10 PK STRL BLUE (TOWEL DISPOSABLE) ×1 IMPLANT

## 2022-04-15 NOTE — Op Note (Signed)
Claire Medina, Claire Medina MEDICAL RECORD NO: [id number] ACCOUNT NO: [id number] DATE OF BIRTH: 1970-12-21 FACILITY: [facility] LOCATION: [facility] PHYSICIAN: Marguerite Jarboe A. Cherly Hensen, MD  Operative Report   DATE OF PROCEDURE: 04/15/2022  PREOPERATIVE DIAGNOSES:  Postmenopausal bleeding, endometrial thickening on sonogram,, endometrial polyp.  PROCEDURE:  Diagnostic hysteroscopy, attempted lysis of adhesions, Dilation and Curettage.  POSTOPERATIVE DIAGNOSIS:  Postmenopausal bleeding, endometrial thickening on sonogram, endometrial polyp and intrauterine adhesions.  ANESTHESIA:  General.  SURGEON:  Hamdi Vari A. Cherly Hensen, MD.  ASSISTANT:  None.  DESCRIPTION OF PROCEDURE:  Under adequate general anesthesia, the patient was placed in dorsal lithotomy position.  She was sterilely prepped and draped in the usual fashion.  The patient had voided prior to entering the room.  Examination under  anesthesia revealed an anteflexed uterus, no adnexal masses could be appreciated.  Bivalve speculum was placed in the vagina and a single-tooth tenaculum was placed on the anterior lip of the cervix.  Cervix was then serially dilated up to #[facility]  dilator.  A diagnostic hysteroscope was introduced into the uterine cavity. An elongated tunnel leading up to an endpoint where there was just too small views with what looks to be a septum or adhesions in the midline in between the openings that were seen.  No ability to go  around this finding nor either backing out of the hysteroscope to see if this is a false tunnel helped.  No other areas was noted.  The patient has a prior history of a cesarean section.  Nonetheless, using the small scissors attempted cutting the band in  between was done, but it did not advance any further into the cavity. Given the findings, the decision was then made to abort doing any MyoSure, area to the location was not that clear. The hysteroscope was removed.  The cavity was gently curetted for   some tissue. I then reinserted the hysteroscope to look in and area of scrapping identified but no further opening to the cavity was seen.  There was no change to the  finding of the uterine cavity.  Therefore, the procedure was terminated by removing all instruments from the vagina.  Fluid deficit was 189 ml.  COMPLICATION:  None.  ESTIMATED BLOOD LOSS: Minimal.  The patient tolerated the procedure well and was transferred to recovery room in stable condition.   PUS D: 04/15/2022 1:23:32 pm T: 04/15/2022 4:51:00 pm  JOB: [id number]/ [id number]

## 2022-04-15 NOTE — Transfer of Care (Signed)
Immediate Anesthesia Transfer of Care Note  Patient: Claire Medina  Procedure(s) Performed: DILATATION & CURETTAGE/HYSTEROSCOPY (Uterus)  Patient Location: PACU  Anesthesia Type:General  Level of Consciousness: drowsy, patient cooperative and responds to stimulation  Airway & Oxygen Therapy: Patient Spontanous Breathing and Patient connected to face mask oxygen  Post-op Assessment: Report given to RN and Post -op Vital signs reviewed and stable  Post vital signs: Reviewed and stable  Last Vitals:  Vitals Value Taken Time  BP 148/94 04/15/22 1303  Temp 36.6 C 04/15/22 1304  Pulse    Resp 13 04/15/22 1306  SpO2    Vitals shown include unvalidated device data.  Last Pain:  Vitals:   04/15/22 1128  TempSrc: Oral  PainSc: 0-No pain      Patients Stated Pain Goal: 5 (04/15/22 1128)  Complications: No notable events documented.

## 2022-04-15 NOTE — Anesthesia Preprocedure Evaluation (Signed)
Anesthesia Evaluation  Patient identified by MRN, date of birth, ID band Patient awake    Reviewed: Allergy & Precautions, NPO status , Patient's Chart, lab work & pertinent test results  Airway Mallampati: II  TM Distance: >3 FB Neck ROM: Full    Dental no notable dental hx.    Pulmonary neg pulmonary ROS,    Pulmonary exam normal breath sounds clear to auscultation       Cardiovascular hypertension, Pt. on medications Normal cardiovascular exam Rhythm:Regular Rate:Normal     Neuro/Psych negative neurological ROS  negative psych ROS   GI/Hepatic Neg liver ROS, GERD  Medicated,  Endo/Other  obesity  Renal/GU negative Renal ROS  negative genitourinary   Musculoskeletal negative musculoskeletal ROS (+)   Abdominal   Peds negative pediatric ROS (+)  Hematology negative hematology ROS (+)   Anesthesia Other Findings   Reproductive/Obstetrics negative OB ROS                             Anesthesia Physical Anesthesia Plan  ASA: 2  Anesthesia Plan: General   Post-op Pain Management: Minimal or no pain anticipated   Induction: Intravenous  PONV Risk Score and Plan: 3 and Ondansetron, Dexamethasone, Treatment may vary due to age or medical condition and Midazolam  Airway Management Planned: LMA  Additional Equipment:   Intra-op Plan:   Post-operative Plan: Extubation in OR  Informed Consent: I have reviewed the patients History and Physical, chart, labs and discussed the procedure including the risks, benefits and alternatives for the proposed anesthesia with the patient or authorized representative who has indicated his/her understanding and acceptance.     Dental advisory given  Plan Discussed with: CRNA and Surgeon  Anesthesia Plan Comments:         Anesthesia Quick Evaluation

## 2022-04-15 NOTE — Interval H&P Note (Signed)
History and Physical Interval Note:  04/15/2022 12:15 PM  Claire Medina  has presented today for surgery, with the diagnosis of post menopausal bleeding, endometrial thickening.  The various methods of treatment have been discussed with the patient and family. After consideration of risks, benefits and other options for treatment, the patient has consented to  Procedure(s): DILATATION & CURETTAGE/HYSTEROSCOPY WITH MYOSURE (N/A) as a surgical intervention.  The patient's history has been reviewed, patient examined, no change in status, stable for surgery.  I have reviewed the patient's chart and labs.  Questions were answered to the patient's satisfaction.     Alakai Macbride A Ballard Budney

## 2022-04-15 NOTE — Discharge Instructions (Addendum)
     No ibuprofen, Advil, Aleve, Motrin, ketorolac, meloxicam, naproxen, or other NSAIDS until after 6:30 pm today if needed.      Post Anesthesia Home Care Instructions  Activity: Get plenty of rest for the remainder of the day. A responsible individual must stay with you for 24 hours following the procedure.  For the next 24 hours, DO NOT: -Drive a car -Advertising copywriter -Drink alcoholic beverages -Take any medication unless instructed by your physician -Make any legal decisions or sign important papers.  Meals: Start with liquid foods such as gelatin or soup. Progress to regular foods as tolerated. Avoid greasy, spicy, heavy foods. If nausea and/or vomiting occur, drink only clear liquids until the nausea and/or vomiting subsides. Call your physician if vomiting continues.  Special Instructions/Symptoms: Your throat may feel dry or sore from the anesthesia or the breathing tube placed in your throat during surgery. If this causes discomfort, gargle with warm salt water. The discomfort should disappear within 24 hours.

## 2022-04-15 NOTE — Brief Op Note (Signed)
04/15/2022  1:16 PM  PATIENT:  Claire Medina  51 y.o. female  PRE-OPERATIVE DIAGNOSIS:  post menopausal bleeding, endometrial thickening  POST-OPERATIVE DIAGNOSIS:  post menopausal bleeding, endometrial thickening, intrauterine adhesions  PROCEDURE:  diagnostic hysteroscopy, attempted lysis of adhesion, dilation and curettage  SURGEON:  Surgeon(s) and Role:    * Maxie Better, MD - Primary  PHYSICIAN ASSISTANT:   ASSISTANTS: none   ANESTHESIA:   general FINDINGS; elongated endocervical canal, obliterated intrauterine cavity EBL:  5 mL   BLOOD ADMINISTERED:none  DRAINS: none   LOCAL MEDICATIONS USED:  NONE  SPECIMEN:  Source of Specimen:  EM[facility]  DISPOSITION OF SPECIMEN:  PATHOLOGY  COUNTS:  YES  TOURNIQUET:  * No tourniquets in log *  DICTATION: .Other Dictation: Dictation Number [id number]  PLAN OF CARE: Discharge to home after PACU  PATIENT DISPOSITION:  PACU - hemodynamically stable.   Delay start of Pharmacological VTE agent (>24hrs) due to surgical blood loss or risk of bleeding: no

## 2022-04-15 NOTE — Anesthesia Procedure Notes (Signed)
Procedure Name: LMA Insertion Date/Time: 04/15/2022 12:25 PM  Performed by: Bishop Limbo, CRNAPre-anesthesia Checklist: Patient identified, Emergency Drugs available, Suction available and Patient being monitored Patient Re-evaluated:Patient Re-evaluated prior to induction Oxygen Delivery Method: Circle System Utilized Preoxygenation: Pre-oxygenation with 100% oxygen Induction Type: IV induction Ventilation: Mask ventilation without difficulty LMA: LMA inserted LMA Size: 4.0 Number of attempts: 1 Placement Confirmation: positive ETCO2 Tube secured with: Tape Dental Injury: Teeth and Oropharynx as per pre-operative assessment

## 2022-04-15 NOTE — H&P (Signed)
Claire Medina is an 51 y.o. female. G2P1011 Claire Medina presents for dx hysteroscopy, D&C, resection of endometrial polyps using Myosure. Pt presented with PMB. She underwent sonogram which showed thickened endometrium, uterine fibroids. Pt had ebx which showed benign fragments of endometrial polyps, disordered proliferative endometrium. Pt here for further surgical management  Pertinent Gynecological History: Menses: post-menopausal Bleeding: post menopausal bleeding Contraception: none DES exposure: denies Blood transfusions: none Sexually transmitted diseases: no past history Previous GYN Procedures:  TL   D%E Last mammogram: normal Date: 02/09/2022 Last pap: normal Date: 02/07/2022 OB History: G2, P1011   Menstrual History: Menarche age: n/a No LMP recorded (lmp unknown). Patient is perimenopausal.    Past Medical History:  Diagnosis Date   Hypertension    Sciatic leg pain    R side   Tendonitis of elbow, left     Past Surgical History:  Procedure Laterality Date   Cesarean section     TUBAL LIGATION  2000  Uterine artery embolization  Family History  Problem Relation Age of Onset   Allergic rhinitis Son    Asthma Son    Urticaria Neg Hx    Immunodeficiency Neg Hx    Eczema Neg Hx    Angioedema Neg Hx     Social History:  reports that she has never smoked. She has never used smokeless tobacco. She reports current alcohol use. She reports that she does not use drugs.  Allergies:  Allergies  Allergen Reactions   Other Hives    Mango Hisbiscus Tea    No medications prior to admission.    Review of Systems  All other systems reviewed and are negative.   Height 5\' 6"  (1.676 m), weight 90.7 kg. Physical Exam Constitutional:      Appearance: Normal appearance. She is obese.  HENT:     Head: Atraumatic.  Eyes:     Extraocular Movements: Extraocular movements intact.  Cardiovascular:     Rate and Rhythm: Regular rhythm.     Heart sounds: Normal heart sounds.   Abdominal:     Palpations: Abdomen is soft.     Comments: Vertical scar  Genitourinary:    General: Normal vulva.     Comments: Vagina pale atropic mucosa Cervix nulliparous  Uterus AV 10 wk irreg Adnexa no palp mass Musculoskeletal:        General: Normal range of motion.     Cervical back: Neck supple.  Skin:    General: Skin is dry.  Neurological:     General: No focal deficit present.     Mental Status: She is alert and oriented to person, place, and time.  Psychiatric:        Mood and Affect: Mood normal.        Behavior: Behavior normal.     No results found for this or any previous visit (from the past 24 hour(s)).  No results found.  Assessment/Plan: PMB  Endometrial polyp Endometrial thickening on sonogram P) dx hysteroscopy, D&C resection of endometrial polyps. Procedure explained. Risk of surgery reviewed including infection, bleeding , poss need for blood transfusion and its risk( HIV, acute rxn, hepatitis), injury to surrounding organ structures, thermal injury, fluid overload. All ? Answered. Vaginal cytotec for cervical ripening  Shiraz Bastyr A Terance Pomplun 04/15/2022, 6:49 AM

## 2022-04-15 NOTE — Anesthesia Postprocedure Evaluation (Signed)
Anesthesia Post Note  Patient: Claire Medina  Procedure(s) Performed: DILATATION & CURETTAGE/HYSTEROSCOPY (Uterus)     Patient location during evaluation: PACU Anesthesia Type: General Level of consciousness: awake and alert Pain management: pain level controlled Vital Signs Assessment: post-procedure vital signs reviewed and stable Respiratory status: spontaneous breathing, nonlabored ventilation, respiratory function stable and patient connected to nasal cannula oxygen Cardiovascular status: blood pressure returned to baseline and stable Postop Assessment: no apparent nausea or vomiting Anesthetic complications: no   No notable events documented.  Last Vitals:  Vitals:   04/15/22 1330 04/15/22 1345  BP: (!) 140/84 136/86  Pulse:    Resp: 14 14  Temp:  36.7 C  SpO2: 100%     Last Pain:  Vitals:   04/15/22 1330  TempSrc:   PainSc: 4                  Juliann Olesky S

## 2022-04-18 ENCOUNTER — Encounter (HOSPITAL_BASED_OUTPATIENT_CLINIC_OR_DEPARTMENT_OTHER): Payer: Self-pay | Admitting: Obstetrics and Gynecology

## 2022-04-18 LAB — SURGICAL PATHOLOGY

## 2022-10-05 ENCOUNTER — Ambulatory Visit: Payer: BC Managed Care – PPO | Admitting: Internal Medicine

## 2022-10-05 ENCOUNTER — Encounter: Payer: Self-pay | Admitting: Internal Medicine

## 2022-10-05 VITALS — BP 138/84 | HR 79 | Temp 97.6°F | Resp 18 | Wt 229.1 lb

## 2022-10-05 DIAGNOSIS — T7800XD Anaphylactic reaction due to unspecified food, subsequent encounter: Secondary | ICD-10-CM

## 2022-10-05 DIAGNOSIS — L508 Other urticaria: Secondary | ICD-10-CM | POA: Diagnosis not present

## 2022-10-05 MED ORDER — EPINEPHRINE 0.3 MG/0.3ML IJ SOAJ: INTRAMUSCULAR | 3 refills | Status: AC

## 2022-10-05 MED ORDER — CETIRIZINE HCL 10 MG PO TABS: 10.0000 mg | ORAL_TABLET | Freq: Every day | ORAL | 5 refills | Status: AC | PRN

## 2022-10-05 MED ORDER — METHYLPREDNISOLONE ACETATE 40 MG/ML IJ SUSP
40.0000 mg | Freq: Once | INTRAMUSCULAR | Status: AC
  Administered 2022-10-05: 40 mg via INTRAMUSCULAR

## 2022-10-05 NOTE — Progress Notes (Unsigned)
Follow Up Note  RE: Claire Medina MRN: [id number] DOB: 02-11-1971 Date of Office Visit: 10/05/2022  Referring provider: Trey Sailors, PA Primary care provider: Trey Sailors, PA  Chief Complaint: Follow-up (Pt. Broke out Monday night ) and Urticaria  History of Present Illness: I had the pleasure of seeing Claire Medina for a follow up visit at the [facility] on 10/05/2022. She is a 52 y.o. female, who is being followed for urticaria and food allergy. Her previous allergy office visit was on 11/11/20 with Dr. Nelva Bush. Today is a  acute visit for urticaria flare .  History obtained from patient, chart review .  Flare started Sunday night around one am.   She was sleeping in bed and felt sudden itching on her arm.  She woke up Monday morning with 4 urticarial papules on her right arm in a linear fashion. She has tried benadryl cream and over-the-counter Benadryl without good response. Denies any other symptoms elsewhere.  Symptoms and sick since Monday.  After last visit with Dr. Nelva Bush in April 22 she is able to taper off all of her antihistamines without any recurrence of her urticaria is until now previously on Zyrtec and Pepcid daily for her urticaria.  She continues to avoid mango and shrimp.  Needs a refill of her EpiPen. No incidental injections or reactions since last visit.   Assessment and Plan: Claire Medina is a 52 y.o. female with: Other urticaria  Anaphylactic reaction due to food, subsequent encounter   Plan: Patient Instructions   Urticaria  - Based on linear presentation (breakfest, lunch and dinner sign) I suspect bed bugs or other insect bites  - Information on bedbug infestation and remediation provided to patient -40 mg Depo-Medrol IM given in clinic today -Can use triamcinolone 0.1% cream twice a day for residual symptoms -Continue Zyrtec 10 mg twice a day for residual symptoms  Food allergy  - continue avoidance of mango and  shrimp/shellfish  - have access to self-injectable epinephrine (Epipen or AuviQ) 0.'3mg'$  at all times  - follow emergency action plan in case of allergic reaction  Follow up: as needed   Thank you so much for letting me partake in your care today.  Don't hesitate to reach out if you have any additional concerns!  Roney Marion, MD  [facility] [place] [place]    Meds ordered this encounter  Medications   methylPREDNISolone acetate (DEPO-MEDROL) injection 40 mg   EPINEPHrine (AUVI-Q) 0.3 mg/0.3 mL IJ SOAJ injection    Sig: Use as directed for severe allergic reaction    Dispense:  2 each    Refill:  3    Dispense one carton of 2 devices plus trainer.   cetirizine (ZYRTEC) 10 MG tablet    Sig: Take 1 tablet (10 mg total) by mouth daily as needed for allergies.    Dispense:  32 tablet    Refill:  5    Lab Orders  No laboratory test(s) ordered today   Diagnostics: None done   Medication List:  Current Outpatient Medications  Medication Sig Dispense Refill   amLODipine (NORVASC) 5 MG tablet Take 5 mg by mouth daily.     cetirizine (ZYRTEC) 10 MG tablet Take 1 tablet (10 mg total) by mouth daily as needed for allergies. 32 tablet 5   EPINEPHrine (AUVI-Q) 0.3 mg/0.3 mL IJ SOAJ injection Use as directed for severe allergic reaction 2 each 3   famotidine (PEPCID) 20 MG  tablet TAKE 1 TABLET BY MOUTH TWICE A DAY (Patient taking differently: daily as needed.) 60 tablet 5   fluticasone (FLONASE) 50 MCG/ACT nasal spray Place 1 spray into both nostrils daily as needed.     hydrochlorothiazide (HYDRODIURIL) 25 MG tablet Take 25 mg by mouth daily.     Multiple Vitamins-Minerals (COMPLETE MULTIVITAMIN/MINERAL PO) Take 1 tablet by mouth daily.     omeprazole (PRILOSEC) 20 MG capsule Take 20 mg by mouth daily as needed.     timolol (TIMOPTIC) 0.5 % ophthalmic solution Place 1 drop into both eyes daily.     No current facility-administered medications for this visit.    Allergies: Allergies  Allergen Reactions   Other Hives and Other (See Comments)    Mango Hisbiscus Tea   Dust Mite Extract    I reviewed her past medical history, social history, family history, and environmental history and no significant changes have been reported from her previous visit.  ROS: All others negative except as noted per HPI.   Objective: BP 138/84   Pulse 79   Temp 97.6 F (36.4 C) (Temporal)   Resp 18   Wt 229 lb 1.6 oz (103.9 kg)   SpO2 98%   BMI 36.98 kg/m  Body mass index is 36.98 kg/m. General Appearance:  Alert, cooperative, no distress, appears stated age  Head:  Normocephalic, without obvious abnormality, atraumatic  Eyes:  Conjunctiva clear, EOM's intact  Nose: Nares normal,   Throat: Lips, tongue normal; teeth and gums normal,   Neck: Supple, symmetrical  Lungs:   , Respirations unlabored, no coughing  Heart:  , Appears well perfused  Extremities: No edema  Skin: Skin color, texture, turgor normal, "erythematous macules in a linear pattern down the right upper arm  Neurologic: No gross deficits   Previous notes and tests were reviewed. The plan was reviewed with the patient/family, and all questions/concerned were addressed.  It was my pleasure to see Claire Medina today and participate in her care. Please feel free to contact me with any questions or concerns.  Sincerely,  Roney Marion, MD  Allergy & Immunology  [facility] [place] Office: [phone]

## 2022-10-05 NOTE — Patient Instructions (Addendum)
Urticaria  - Based on linear presentation (breakfest, lunch and dinner sign) I suspect bed bugs or other insect bites  - Information on bedbug infestation and remediation provided to patient -40 mg Depo-Medrol IM given in clinic today -Can use triamcinolone 0.1% cream twice a day for residual symptoms -Continue Zyrtec 10 mg twice a day for residual symptoms  Food allergy  - continue avoidance of mango and shrimp/shellfish  - have access to self-injectable epinephrine (Epipen or AuviQ) 0.'3mg'$  at all times  - follow emergency action plan in case of allergic reaction  Follow up: as needed   Thank you so much for letting me partake in your care today.  Don't hesitate to reach out if you have any additional concerns!  Roney Marion, MD  [facility] [place], [place]

## 2023-10-23 ENCOUNTER — Institutional Professional Consult (permissible substitution) (INDEPENDENT_AMBULATORY_CARE_PROVIDER_SITE_OTHER): Payer: BC Managed Care – PPO
